# Patient Record
Sex: Female | Born: 1960 | Race: White | Hispanic: No | Marital: Single | Smoking: Never smoker
Health system: Southern US, Community
[De-identification: ages and names within clinical notes are randomized; demographics above are authoritative.]

## PROBLEM LIST (undated history)

## (undated) DIAGNOSIS — N200 Calculus of kidney: Secondary | ICD-10-CM

## (undated) DIAGNOSIS — K760 Fatty (change of) liver, not elsewhere classified: Secondary | ICD-10-CM

## (undated) DIAGNOSIS — K8020 Calculus of gallbladder without cholecystitis without obstruction: Secondary | ICD-10-CM

## (undated) DIAGNOSIS — C801 Malignant (primary) neoplasm, unspecified: Secondary | ICD-10-CM

## (undated) DIAGNOSIS — R319 Hematuria, unspecified: Secondary | ICD-10-CM

## (undated) HISTORY — PX: BREAST REDUCTION SURGERY: SHX8

## (undated) HISTORY — DX: Calculus of gallbladder without cholecystitis without obstruction: K80.20

## (undated) HISTORY — PX: TONSILLECTOMY: SUR1361

## (undated) HISTORY — PX: SOFT TISSUE TUMOR RESECTION: SHX1054

## (undated) HISTORY — DX: Calculus of kidney: N20.0

## (undated) HISTORY — PX: ABDOMINAL SURGERY: SHX537

## (undated) HISTORY — DX: Hematuria, unspecified: R31.9

## (undated) HISTORY — DX: Fatty (change of) liver, not elsewhere classified: K76.0

---

## 2011-07-23 ENCOUNTER — Encounter: Payer: Self-pay | Admitting: *Deleted

## 2011-07-23 ENCOUNTER — Emergency Department
Admission: EM | Admit: 2011-07-23 | Discharge: 2011-07-23 | Disposition: A | Discharge status: Home / Self Care | Payer: 59 | Source: Home / Self Care

## 2011-07-23 DIAGNOSIS — Z23 Encounter for immunization: Secondary | ICD-10-CM

## 2011-07-23 MED ORDER — INFLUENZA VAC TYP A&B SURF ANT IM INJ
0.5000 mL | INJECTION | Freq: Once | INTRAMUSCULAR | Status: AC
  Administered 2011-07-23: 0.5 mL via INTRAMUSCULAR

## 2011-07-23 NOTE — ED Notes (Signed)
The pt is here today for a flu vaccine.  

## 2014-11-17 ENCOUNTER — Emergency Department (INDEPENDENT_AMBULATORY_CARE_PROVIDER_SITE_OTHER)
Admission: EM | Admit: 2014-11-17 | Discharge: 2014-11-17 | Disposition: A | Discharge status: Home / Self Care | Payer: 59 | Source: Home / Self Care | Attending: Family Medicine | Admitting: Family Medicine

## 2014-11-17 ENCOUNTER — Encounter: Payer: Self-pay | Admitting: *Deleted

## 2014-11-17 DIAGNOSIS — J0120 Acute ethmoidal sinusitis, unspecified: Secondary | ICD-10-CM

## 2014-11-17 MED ORDER — FLUTICASONE PROPIONATE 50 MCG/ACT NA SUSP: 2.0000 | Freq: Every day | NASAL | Status: DC

## 2014-11-17 MED ORDER — SALINE SPRAY 0.65 % NA SOLN: 1.0000 | NASAL | Status: DC | PRN

## 2014-11-17 MED ORDER — AMOXICILLIN-POT CLAVULANATE 250-62.5 MG/5ML PO SUSR: 250.0000 mg | Freq: Three times a day (TID) | ORAL | Status: AC

## 2014-11-17 MED ORDER — AMOXICILLIN-POT CLAVULANATE 400-57 MG/5ML PO SUSR: 1000.0000 mg | Freq: Two times a day (BID) | ORAL | Status: AC

## 2014-11-17 NOTE — Progress Notes (Signed)
EDCM received phone call from [facility] regarding augmentin prescription strength 250mg  of which they do not carry.  [facility] requesting [facility] dosage strength.  EDCM spoke to [place] who changed prescription to Augmentin 400-57 per 5cc's take 12.5cc's (1000mg  total) twice daily for 10 days.  [facility] called [facility] to [facility] at [phone] and spoke to [facility].  No further EDCM needs at this time.

## 2014-11-17 NOTE — ED Notes (Signed)
Pt c/o productive cough and wheezing at night x 2 wks. She reports some blood tinged mucus this morning. Denies fever.

## 2014-11-17 NOTE — Discharge Instructions (Signed)
Be sure to rest and stay well hydrated. You may find that a humidifier or warm shower before bed with help you feel more comfortable. You may take acetaminophen and/or ibuprofen for fever and pain.  Today you were diagnosed with a sinus infection and prescribed an antibiotic. Be sure to complete the entire course even if you start to feel better. See below for further instructions.   Sinusitis Sinusitis is redness, soreness, and puffiness (inflammation) of the air pockets in the bones of your face (sinuses). The redness, soreness, and puffiness can cause air and mucus to get trapped in your sinuses. This can allow germs to grow and cause an infection.  HOME CARE   Drink enough fluids to keep your pee (urine) clear or pale yellow.  Use a humidifier in your home.  Run a hot shower to create steam in the bathroom. Sit in the bathroom with the door closed. Breathe in the steam 3-4 times a day.  Put a warm, moist washcloth on your face 3-4 times a day, or as told by your doctor.  Use salt water sprays (saline sprays) to wet the thick fluid in your nose. This can help the sinuses drain.  Only take medicine as told by your doctor. GET HELP RIGHT AWAY IF:   Your pain gets worse.  You have very bad headaches.  You are sick to your stomach (nauseous).  You throw up (vomit).  You are very sleepy (drowsy) all the time.  Your face is puffy (swollen).  Your vision changes.  You have a stiff neck.  You have trouble breathing. MAKE SURE YOU:   Understand these instructions.  Will watch your condition.  Will get help right away if you are not doing well or get worse. Document Released: 01/16/2008 Document Revised: 04/23/2012 Document Reviewed: 03/04/2012 [facility] Patient Information 2015 [place], [place]. This information is not intended to replace advice given to you by your health care provider. Make sure you discuss any questions you have with your health care provider.  Upper  Respiratory Infection, Adult An upper respiratory infection (URI) is also known as the common cold. It is often caused by a type of germ (virus). Colds are easily spread (contagious). You can pass it to others by kissing, coughing, sneezing, or drinking out of the same glass. Usually, you get better in 1 or 2 weeks.  HOME CARE   Only take medicine as told by your doctor.  Use a warm mist humidifier or breathe in steam from a hot shower.  Drink enough water and fluids to keep your pee (urine) clear or pale yellow.  Get plenty of rest.  Return to work when your temperature is back to normal or as told by your doctor. You may use a face mask and wash your hands to stop your cold from spreading. GET HELP RIGHT AWAY IF:   After the first few days, you feel you are getting worse.  You have questions about your medicine.  You have chills, shortness of breath, or brown or red spit (mucus).  You have yellow or brown snot (nasal discharge) or pain in the face, especially when you bend forward.  You have a fever, puffy (swollen) neck, pain when you swallow, or white spots in the back of your throat.  You have a bad headache, ear pain, sinus pain, or chest pain.  You have a high-pitched whistling sound when you breathe in and out (wheezing).  You have a lasting cough or cough up blood.  You have sore muscles or a stiff neck. MAKE SURE YOU:   Understand these instructions.  Will watch your condition.  Will get help right away if you are not doing well or get worse. Document Released: 01/16/2008 Document Revised: 10/22/2011 Document Reviewed: 11/04/2013 [facility] Patient Information 2015 [place], [place]. This information is not intended to replace advice given to you by your health care provider. Make sure you discuss any questions you have with your health care provider.

## 2014-11-17 NOTE — ED Provider Notes (Signed)
CSN: [id number]     Arrival date & time 11/17/14  1517 History   First MD Initiated Contact with Patient 11/17/14 1538     Chief Complaint  Patient presents with  . Cough   (Consider location/radiation/quality/duration/timing/severity/associated sxs/prior Treatment) HPI  Pt is a 54yo female presenting to urgent care with c/o cough with associated wheeze at night, facial pain with headaches and Left ear pain for the last 2 weeks.  Pt reports this morning she became concerned when she coughed up a small amount of blood tinged mucus one time.  States she has tried ibuprofen at home with minimal relief. No other medications tried at home. Denies fever, chills, n/v/d. States her son at home is also sick with similar symptoms. No recent travel. No hx of asthma or COPD. Pt is not a smoker.   History reviewed. No pertinent past medical history. Past Surgical History  Procedure Laterality Date  . Abdominal surgery      csection  . Tonsillectomy    . Cesarean section     Family History  Problem Relation Age of Onset  . Cancer Mother     lung  . Heart attack Father    History  Substance Use Topics  . Smoking status: Never Smoker   . Smokeless tobacco: Not on file  . Alcohol Use: No   OB History    No data available     Review of Systems  Constitutional: Positive for fatigue. Negative for fever, chills and appetite change.  HENT: Positive for congestion, rhinorrhea, sinus pressure and sore throat. Negative for ear pain, facial swelling and nosebleeds.   Respiratory: Positive for cough. Negative for shortness of breath.   Cardiovascular: Negative for chest pain and palpitations.  Gastrointestinal: Negative for nausea, vomiting, abdominal pain and diarrhea.  Musculoskeletal: Negative for myalgias, back pain, neck pain and neck stiffness.  All other systems reviewed and are negative.   Allergies  Review of patient's allergies indicates no known allergies.  Home Medications   Prior  to Admission medications   Medication Sig Start Date End Date Taking? Authorizing Provider  amoxicillin-clavulanate (AUGMENTIN) 250-62.5 MG/5ML suspension Take 5 mLs (250 mg total) by mouth 3 (three) times daily. For 10 days 11/17/14 11/24/14  Noland Fordyce, PA-C  fluticasone [facility]) 50 MCG/ACT nasal spray Place 2 sprays into both nostrils daily. 11/17/14   Noland Fordyce, PA-C  sodium chloride (OCEAN) 0.65 % SOLN nasal spray Place 1 spray into both nostrils as needed for congestion. 11/17/14   Noland Fordyce, PA-C   BP 148/85 mmHg  Pulse 96  Temp(Src) 98.1 F (36.7 C)  Resp 18  Ht 5\' 4"  (1.626 m)  Wt 247 lb (112.038 kg)  BMI 42.38 kg/m2  SpO2 96%  LMP 10/26/2014 Physical Exam  Constitutional: She appears well-developed and well-nourished. No distress.  HENT:  Head: Normocephalic and atraumatic.  Right Ear: Hearing, tympanic membrane and external ear normal. A foreign body ( eustachian tube (permanent) ) is present.  Left Ear: Hearing, tympanic membrane, external ear and ear canal normal.  Nose: Mucosal edema present. Right sinus exhibits frontal sinus tenderness. Right sinus exhibits no maxillary sinus tenderness. Left sinus exhibits frontal sinus tenderness. Left sinus exhibits no maxillary sinus tenderness.  Mouth/Throat: Uvula is midline, oropharynx is clear and moist and mucous membranes are normal.  Eyes: Conjunctivae are normal. No scleral icterus.  Neck: Normal range of motion.  Cardiovascular: Normal rate, regular rhythm and normal heart sounds.   Pulmonary/Chest: Effort normal and breath sounds  normal. No respiratory distress. She has no wheezes. She has no rales. She exhibits no tenderness.  Abdominal: Soft. She exhibits no distension. There is no tenderness.  Musculoskeletal: Normal range of motion.  Neurological: She is alert.  Skin: Skin is warm and dry. She is not diaphoretic.  Nursing note and vitals reviewed.   ED Course  Procedures (including critical care time) Labs  Review Labs Reviewed - No data to display  Imaging Review No results found.   MDM   1. Acute ethmoidal sinusitis, recurrence not specified      Pt presenting to urgent care with c/o URI symptoms for 2 weeks.  Symptoms most concerning for pt are head pain and 1 episode of blood tinged sputum this morning.  Pt reports wheeze at night but denies chest pain or SOB. On exam, pt is afebrile, appears well, non-toxic.  Pt does have tenderness to frontal sinuses on exam with nasal mucosal edema and erythema. Lungs: CTAB. Symptoms more c/w sinusitis. Doubt pneumonia at this time. Doubt TB. Blood most likely due to irritation in pt's sinuses. Rx: augmentin, flonase and ocean nasal spray.  Home care instructions provided. Advised to f/u with PCP or return to UC as needed if symptoms not improving in 1 week. Pt verbalized understanding and agreement with tx plan.    Noland Fordyce, PA-C 11/17/14 1623

## 2015-07-18 ENCOUNTER — Emergency Department (INDEPENDENT_AMBULATORY_CARE_PROVIDER_SITE_OTHER): Payer: 59

## 2015-07-18 ENCOUNTER — Emergency Department (INDEPENDENT_AMBULATORY_CARE_PROVIDER_SITE_OTHER)
Admission: EM | Admit: 2015-07-18 | Discharge: 2015-07-18 | Disposition: A | Discharge status: Home / Self Care | Payer: 59 | Source: Home / Self Care | Attending: Family Medicine | Admitting: Family Medicine

## 2015-07-18 ENCOUNTER — Encounter: Payer: Self-pay | Admitting: *Deleted

## 2015-07-18 DIAGNOSIS — S8012XA Contusion of left lower leg, initial encounter: Secondary | ICD-10-CM

## 2015-07-18 DIAGNOSIS — M79662 Pain in left lower leg: Secondary | ICD-10-CM | POA: Diagnosis not present

## 2015-07-18 DIAGNOSIS — M25562 Pain in left knee: Secondary | ICD-10-CM | POA: Diagnosis not present

## 2015-07-18 DIAGNOSIS — W010XXA Fall on same level from slipping, tripping and stumbling without subsequent striking against object, initial encounter: Secondary | ICD-10-CM

## 2015-07-18 DIAGNOSIS — W1839XA Other fall on same level, initial encounter: Secondary | ICD-10-CM

## 2015-07-18 MED ORDER — TRAMADOL HCL 50 MG PO TABS: 50.0000 mg | ORAL_TABLET | Freq: Four times a day (QID) | ORAL | Status: DC | PRN

## 2015-07-18 MED ORDER — IBUPROFEN 600 MG PO TABS: 600.0000 mg | ORAL_TABLET | Freq: Four times a day (QID) | ORAL | Status: DC | PRN

## 2015-07-18 NOTE — ED Notes (Signed)
Patients reports stepping out of a booth at [employer] [employer], missing the step next to the booth and falling onto her left knee. Also c/o left hand pain and aching all over.

## 2015-07-18 NOTE — Discharge Instructions (Signed)
Tramadol is strong pain medication. While taking, do not drink alcohol, drive, or perform any other activities that requires focus while taking this medication. You may take ibuprofen at the same time as this medication.   Ibuprofen should not make you tired so you should be able to take every 6-8 hours for pain and swelling.    Contusion A contusion is a deep bruise. Contusions are the result of a blunt injury to tissues and muscle fibers under the skin. The injury causes bleeding under the skin. The skin overlying the contusion may turn blue, purple, or yellow. Minor injuries will give you a painless contusion, but more severe contusions may stay painful and swollen for a few weeks.  CAUSES  This condition is usually caused by a blow, trauma, or direct force to an area of the body. SYMPTOMS  Symptoms of this condition include:  Swelling of the injured area.  Pain and tenderness in the injured area.  Discoloration. The area may have redness and then turn blue, purple, or yellow. DIAGNOSIS  This condition is diagnosed based on a physical exam and medical history. An X-ray, CT scan, or MRI may be needed to determine if there are any associated injuries, such as broken bones (fractures). TREATMENT  Specific treatment for this condition depends on what area of the body was injured. In general, the best treatment for a contusion is resting, icing, applying pressure to (compression), and elevating the injured area. This is often called the RICE strategy. Over-the-counter anti-inflammatory medicines may also be recommended for pain control.  HOME CARE INSTRUCTIONS   Rest the injured area.  If directed, apply ice to the injured area:  Put ice in a plastic bag.  Place a towel between your skin and the bag.  Leave the ice on for 20 minutes, 2-3 times per day.  If directed, apply light compression to the injured area using an elastic bandage. Make sure the bandage is not wrapped too tightly.  Remove and reapply the bandage as directed by your health care provider.  If possible, raise (elevate) the injured area above the level of your heart while you are sitting or lying down.  Take over-the-counter and prescription medicines only as told by your health care provider. SEEK MEDICAL CARE IF:  Your symptoms do not improve after several days of treatment.  Your symptoms get worse.  You have difficulty moving the injured area. SEEK IMMEDIATE MEDICAL CARE IF:   You have severe pain.  You have numbness in a hand or foot.  Your hand or foot turns pale or cold.   This information is not intended to replace advice given to you by your health care provider. Make sure you discuss any questions you have with your health care provider.   Document Released: 05/09/2005 Document Revised: 04/20/2015 Document Reviewed: 12/15/2014 Elsevier Interactive Patient Education [employer].

## 2015-07-18 NOTE — ED Provider Notes (Signed)
CSN: [id number]     Arrival date & time 07/18/15  1637 History   First MD Initiated Contact with Patient 07/18/15 1652     Chief Complaint  Patient presents with  . Knee Pain   (Consider location/radiation/quality/duration/timing/severity/associated sxs/prior Treatment) HPI Pt is a 54yo female presenting to [facility] with c/o Left knee pain after falling last night at [facility].  Pt states she was in a booth that had a step down, she missed the step and fell directly onto Left knee.  Pt also c/o Left little finger pain but states "I think it's just jammed, it will be okay."  Pt states she was not going to come but the manager at the restaurant insisted she come be evaluated.  Pain in knee is aching and throbbing, 5/10, improved moderately with Advil last night but woke her around 3AM throbbing.  Pt also reports mild generalized headache but does not recall hitting her head or LOC. Denies nausea or vomiting. Denies neck or back pain.   History reviewed. No pertinent past medical history. Past Surgical History  Procedure Laterality Date  . Abdominal surgery      csection  . Tonsillectomy    . Cesarean section     Family History  Problem Relation Age of Onset  . Cancer Mother     lung  . Heart attack Father    Social History  Substance Use Topics  . Smoking status: Never Smoker   . Smokeless tobacco: None  . Alcohol Use: No   OB History    No data available     Review of Systems  Musculoskeletal: Positive for myalgias, joint swelling, arthralgias and gait problem. Negative for back pain.       Left knee  Skin: Positive for color change. Negative for wound.  Neurological: Negative for weakness and numbness.    Allergies  Review of patient's allergies indicates no known allergies.  Home Medications   Prior to Admission medications   Medication Sig Start Date End Date Taking? Authorizing Provider  fluticasone (FLONASE) 50 MCG/ACT nasal spray Place 2 sprays into both nostrils  daily. 11/17/14   Noland Fordyce, PA-C  ibuprofen (ADVIL,MOTRIN) 600 MG tablet Take 1 tablet (600 mg total) by mouth every 6 (six) hours as needed. 07/18/15   Noland Fordyce, PA-C  traMADol (ULTRAM) 50 MG tablet Take 1 tablet (50 mg total) by mouth every 6 (six) hours as needed. 07/18/15   Noland Fordyce, PA-C   Meds Ordered and Administered this Visit  Medications - No data to display  BP 135/83 mmHg  Pulse 91  Resp 16  Wt 230 lb (104.327 kg)  SpO2 94%  LMP 10/26/2014 No data found.   Physical Exam  Constitutional: She is oriented to person, place, and time. She appears well-developed and well-nourished.  HENT:  Head: Normocephalic and atraumatic.  Eyes: EOM are normal.  Neck: Normal range of motion. Neck supple.  No midline bone tenderness, no crepitus or step-offs.    Cardiovascular: Normal rate.   Pulmonary/Chest: Effort normal.  Musculoskeletal: Normal range of motion. She exhibits edema and tenderness.       Left knee: Tenderness found.  Left knee: full ROM, tenderness and mild edema over tibial tuberosity.  Mild tenderness to posterior knee. No calf tenderness.    Left little finger: mild tenderness at base of finger and 5th metacarpal. No edema. Full ROM. Full ROM hand and wrist.   Neurological: She is alert and oriented to person, place, and time.  Left leg: normal sensation.  Left hand: normal sensation   Skin: Skin is warm and dry.  Left knee: mild ecchymosis. Skin in tact  Psychiatric: She has a normal mood and affect. Her behavior is normal.  Nursing note and vitals reviewed.   ED Course  Procedures (including critical care time)  Labs Review Labs Reviewed - No data to display  Imaging Review Dg Tibia/fibula Left  07/18/2015  CLINICAL DATA:  Left lower extremity pain status post fall. EXAM: LEFT TIBIA AND FIBULA - 2 VIEW COMPARISON:  None. FINDINGS: There is no evidence of fracture or other focal bone lesions. Soft tissues are unremarkable. IMPRESSION: Negative.  Electronically Signed   By: Fidela Salisbury M.D.   On: 07/18/2015 17:28   Dg Knee Complete 4 Views Left  07/18/2015  CLINICAL DATA:  Recent fall with left knee pain, initial encounter EXAM: LEFT KNEE - COMPLETE 4+ VIEW COMPARISON:  None. FINDINGS: There is no evidence of fracture, dislocation, or joint effusion. There is no evidence of arthropathy or other focal bone abnormality. Soft tissues are unremarkable. IMPRESSION: No acute abnormality noted. Electronically Signed   By: Inez Catalina M.D.   On: 07/18/2015 17:26    MDM   1. Contusion of left leg, initial encounter   2. Left knee pain   3. Fall from slip, trip, or stumble, initial encounter    Pt c/o Left knee pain after a trip and fall last night. Denies hitting her head or LOC.   Pt does c/o Left little finger pain but declined imaging of Left hand stating she thinks she just jammed it. Left knee: no acute fracture or dislocation. Reassured pt. Offered crutches and ace wrap, pt declined.   Discussed RICE home treatment.   Rx: Tramadol and ibuprofen F/u with Dr. Georgina Snell, Sports Medicine, in 1-2 weeks if not improving. Patient verbalized understanding and agreement with treatment plan.           Noland Fordyce, PA-C 07/18/15 [phone]

## 2015-10-14 ENCOUNTER — Emergency Department (INDEPENDENT_AMBULATORY_CARE_PROVIDER_SITE_OTHER)
Admission: EM | Admit: 2015-10-14 | Discharge: 2015-10-14 | Disposition: A | Discharge status: Home / Self Care | Payer: 59 | Source: Home / Self Care | Attending: Family Medicine | Admitting: Family Medicine

## 2015-10-14 ENCOUNTER — Encounter: Payer: Self-pay | Admitting: Emergency Medicine

## 2015-10-14 DIAGNOSIS — J9801 Acute bronchospasm: Secondary | ICD-10-CM

## 2015-10-14 DIAGNOSIS — B9789 Other viral agents as the cause of diseases classified elsewhere: Principal | ICD-10-CM

## 2015-10-14 DIAGNOSIS — B001 Herpesviral vesicular dermatitis: Secondary | ICD-10-CM

## 2015-10-14 DIAGNOSIS — J069 Acute upper respiratory infection, unspecified: Secondary | ICD-10-CM

## 2015-10-14 MED ORDER — PREDNISONE 20 MG PO TABS: 20.0000 mg | ORAL_TABLET | Freq: Two times a day (BID) | ORAL | Status: DC

## 2015-10-14 MED ORDER — AZITHROMYCIN 250 MG PO TABS: ORAL_TABLET | ORAL | Status: DC

## 2015-10-14 MED ORDER — BENZONATATE 200 MG PO CAPS: 200.0000 mg | ORAL_CAPSULE | Freq: Every day | ORAL | Status: DC

## 2015-10-14 MED ORDER — ACYCLOVIR 400 MG PO TABS: ORAL_TABLET | ORAL | Status: DC

## 2015-10-14 NOTE — ED Provider Notes (Signed)
CSN: [id number]     Arrival date & time 10/14/15  0841 History   First MD Initiated Contact with Patient 10/14/15 1012     Chief Complaint  Patient presents with  . Nasal Congestion  . Generalized Body Aches  . Cough      HPI Comments: Patient complains of four day history of typical cold-like symptoms developing over several days,  including mild sore throat, sinus congestion, headache, fatigue, and cough.  She has also developed a cold sore, and requests medication for this.   She has a history of recurrent otitis media, and family history of asthma (father)  The history is provided by the patient.    History reviewed. No pertinent past medical history. Past Surgical History  Procedure Laterality Date  . Abdominal surgery      csection  . Tonsillectomy    . Cesarean section     Family History  Problem Relation Age of Onset  . Cancer Mother     lung  . Heart attack Father   . Hypertension Father    Social History  Substance Use Topics  . Smoking status: Never Smoker   . Smokeless tobacco: None  . Alcohol Use: No   OB History    No data available     Review of Systems + sore throat + hoarse + cough No pleuritic pain + wheezing + nasal congestion + post-nasal drainage No sinus pain/pressure No itchy/red eyes No earache No hemoptysis No SOB + fever, + chills No nausea No vomiting No abdominal pain No diarrhea No urinary symptoms No skin rash + fatigue No myalgias + headache Used OTC meds without relief  Allergies  Review of patient's allergies indicates no known allergies.  Home Medications   Prior to Admission medications   Medication Sig Start Date End Date Taking? Authorizing Provider  acyclovir (ZOVIRAX) 400 MG tablet Take one tab PO TID for 5 days for recurrent cold sores 10/14/15   Kandra Nicolas, MD  azithromycin (ZITHROMAX Z-PAK) 250 MG tablet Take 2 tabs today; then begin one tab once daily for 4 more days. (Rx void after 10/22/15) 10/14/15    Kandra Nicolas, MD  benzonatate (TESSALON) 200 MG capsule Take 1 capsule (200 mg total) by mouth at bedtime. Take as needed for cough 10/14/15   Kandra Nicolas, MD  fluticasone [facility]) 50 MCG/ACT nasal spray Place 2 sprays into both nostrils daily. 11/17/14   Noland Fordyce, PA-C  ibuprofen (ADVIL,MOTRIN) 600 MG tablet Take 1 tablet (600 mg total) by mouth every 6 (six) hours as needed. 07/18/15   Noland Fordyce, PA-C  predniSONE (DELTASONE) 20 MG tablet Take 1 tablet (20 mg total) by mouth 2 (two) times daily. Take with food. 10/14/15   Kandra Nicolas, MD  traMADol (ULTRAM) 50 MG tablet Take 1 tablet (50 mg total) by mouth every 6 (six) hours as needed. 07/18/15   Noland Fordyce, PA-C   Meds Ordered and Administered this Visit  Medications - No data to display  BP 139/85 mmHg  Pulse 95  Temp(Src) 98.4 F (36.9 C) (Oral)  Resp 18  Ht 5\' 4"  (1.626 m)  Wt 234 lb (106.142 kg)  BMI 40.15 kg/m2  SpO2 95%  LMP 10/26/2014 No data found.   Physical Exam Nursing notes and Vital Signs reviewed. Appearance:  Patient appears stated age, and in no acute distress.  Patient is obese (BMI 40.2) Eyes:  Pupils are equal, round, and reactive to light and accomodation.  Extraocular  movement is intact.  Conjunctivae are not inflamed  Ears:  Canals normal;   Ventilation tube in right tympanic membrane.  Left tympanic membrane normal  Nose:  Mildly congested turbinates.  No sinus tenderness. Mouth:  Small lip excoriation present   Pharynx:  Erythema uvula, otherwise normal. Neck:  Supple.  Tender enlarged posterior nodes are palpated bilaterally  Lungs:   Posterior expiratory wheezes.  Breath sounds are equal.  Moving air well. Heart:  Regular rate and rhythm without murmurs, rubs, or gallops.  Abdomen:  Nontender without masses or hepatosplenomegaly.  Bowel sounds are present.  No CVA or flank tenderness.  Extremities:  No edema.  Skin:  No rash present.   ED Course  Procedures  None    MDM   1.  Viral URI with cough   2. Bronchospasm, acute   3. Recurrent cold sores      Begin prednisone burst.  Prescription written for Benzonatate (Tessalon) to take at bedtime for night-time cough.  Rx for Acyclovir. Take plain guaifenesin (1200mg  extended release tabs such as Mucinex) twice daily, with plenty of water, for cough and congestion.  May add Pseudoephedrine (30mg , one or two every 4 to 6 hours) for sinus congestion.  Get adequate rest.   May use Afrin nasal spray (or generic oxymetazoline) twice daily for about 5 days and then discontinue.  Also recommend using saline nasal spray several times daily and saline nasal irrigation (AYR is a common brand).    Try warm salt water gargles for sore throat.  Stop all antihistamines for now, and other non-prescription cough/cold preparations.. Begin Azithromycin if not improving about one week or if persistent fever develops (Given a prescription to hold, with an expiration date)  Follow-up with family doctor if not improving about10 days.     Kandra Nicolas, MD 10/17/15 2142

## 2015-10-14 NOTE — Discharge Instructions (Signed)
Take plain guaifenesin (1200mg  extended release tabs such as Mucinex) twice daily, with plenty of water, for cough and congestion.  May add Pseudoephedrine (30mg , one or two every 4 to 6 hours) for sinus congestion.  Get adequate rest.   May use Afrin nasal spray (or generic oxymetazoline) twice daily for about 5 days and then discontinue.  Also recommend using saline nasal spray several times daily and saline nasal irrigation (AYR is a common brand).    Try warm salt water gargles for sore throat.  Stop all antihistamines for now, and other non-prescription cough/cold preparations.. Begin Azithromycin if not improving about one week or if persistent fever develops  Follow-up with family doctor if not improving about10 days.

## 2015-10-14 NOTE — ED Notes (Signed)
Reports 3 day history of congestion, cough, aches and sense of fever. Took Mucinex and Ibuprofen around 5 am.

## 2016-10-10 ENCOUNTER — Emergency Department (INDEPENDENT_AMBULATORY_CARE_PROVIDER_SITE_OTHER)
Admission: EM | Admit: 2016-10-10 | Discharge: 2016-10-10 | Disposition: A | Discharge status: Home / Self Care | Payer: BLUE CROSS/BLUE SHIELD | Source: Home / Self Care | Attending: Family Medicine | Admitting: Family Medicine

## 2016-10-10 DIAGNOSIS — R300 Dysuria: Secondary | ICD-10-CM

## 2016-10-10 DIAGNOSIS — N3001 Acute cystitis with hematuria: Secondary | ICD-10-CM

## 2016-10-10 LAB — POCT URINALYSIS DIP (MANUAL ENTRY)
Glucose, UA: 100 — AB
Nitrite, UA: POSITIVE — AB
Protein Ur, POC: >=300 — AB
Spec Grav, UA: 1.02 (ref 1.005–1.03)
Urobilinogen, UA: 4 (ref 0–1)
pH, UA: 8.5 (ref 5–8)

## 2016-10-10 MED ORDER — CEPHALEXIN 500 MG PO CAPS: 500.0000 mg | ORAL_CAPSULE | Freq: Two times a day (BID) | ORAL | 0 refills | Status: DC

## 2016-10-10 NOTE — ED Triage Notes (Signed)
Pt stated that she had bloody urine starting yesterday morning.  Today she has been passing clots.

## 2016-10-10 NOTE — ED Provider Notes (Signed)
CSN: [id number]     Arrival date & time 10/10/16  1434 History   First MD Initiated Contact with Patient 10/10/16 1451     Chief Complaint  Patient presents with  . Hematuria   (Consider location/radiation/quality/duration/timing/severity/associated sxs/prior Treatment) HPI  Courtney Carr is a 56 y.o. female presenting to UC with c/o hematuria and mild dysuria with some passing of clots started yesterday.  Hx of similar symptoms about 5-6 months ago.  She has not tried OTC Azo but notes she does drink and eat a lot of lemons without much water.  She wonders if that could have caused current infection. Denies fever, chills, n/v/d. Mild intermittent lower back pain. No vaginal symptoms or abdominal pain.    History reviewed. No pertinent past medical history. Past Surgical History:  Procedure Laterality Date  . ABDOMINAL SURGERY     csection  . CESAREAN SECTION    . TONSILLECTOMY     Family History  Problem Relation Age of Onset  . Cancer Mother     lung  . Heart attack Father   . Hypertension Father    Social History  Substance Use Topics  . Smoking status: Never Smoker  . Smokeless tobacco: Not on file  . Alcohol use No   OB History    No data available     Review of Systems  Constitutional: Negative for chills and fever.  Gastrointestinal: Negative for abdominal pain, diarrhea, nausea and vomiting.  Genitourinary: Positive for dysuria and hematuria. Negative for frequency, pelvic pain, urgency, vaginal bleeding, vaginal discharge and vaginal pain.  Musculoskeletal: Positive for back pain. Negative for myalgias.    Allergies  Patient has no known allergies.  Home Medications   Prior to Admission medications   Medication Sig Start Date End Date Taking? 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 (Rx void after 10/22/15) 10/14/15   Kandra Nicolas, MD  benzonatate (TESSALON) 200 MG capsule Take 1 capsule (200 mg total) by mouth at bedtime. Take as needed for cough 10/14/15   Kandra Nicolas, MD  cephALEXin (KEFLEX) 500 MG capsule Take 1 capsule (500 mg total) by mouth 2 (two) times daily. 10/10/16   Noland Fordyce, PA-C  fluticasone (FLONASE) 50 MCG/ACT nasal spray Place 2 sprays into both nostrils daily. 11/17/14   Noland Fordyce, PA-C  ibuprofen (ADVIL,MOTRIN) 600 MG tablet Take 1 tablet (600 mg total) by mouth every 6 (six) hours as needed. 07/18/15   Noland Fordyce, PA-C  predniSONE (DELTASONE) 20 MG tablet Take 1 tablet (20 mg total) by mouth 2 (two) times daily. Take with food. 10/14/15   Kandra Nicolas, MD  traMADol (ULTRAM) 50 MG tablet Take 1 tablet (50 mg total) by mouth every 6 (six) hours as needed. 07/18/15   Noland Fordyce, PA-C   Meds Ordered and Administered this Visit  Medications - No data to display  BP 115/84 (BP Location: Left Arm)   Pulse 104   Temp 98.2 F (36.8 C) (Oral)   Ht 5\' 4"  (1.626 m)   Wt 246 lb (111.6 kg)   LMP 10/26/2014   SpO2 95%   BMI 42.23 kg/m  No data found.   Physical Exam  Constitutional: She is oriented to person, place, and time. She appears  well-developed and well-nourished. No distress.  HENT:  Head: Normocephalic and atraumatic.  Mouth/Throat: Oropharynx is clear and moist.  Eyes: EOM are normal.  Neck: Normal range of motion.  Cardiovascular: Normal rate and regular rhythm.   Pulmonary/Chest: Effort normal and breath sounds normal. No respiratory distress. She has no wheezes. She has no rales.  Abdominal: Soft. She exhibits no distension and no mass. There is no tenderness. There is no rebound, no guarding and no CVA tenderness.  Musculoskeletal: Normal range of motion.  Neurological: She is alert and oriented to person, place, and time.  Skin: Skin is warm and dry. She is not diaphoretic.  Psychiatric:  She has a normal mood and affect. Her behavior is normal.  Nursing note and vitals reviewed.   Urgent Care Course     Procedures (including critical care time)  Labs Review Labs Reviewed  POCT URINALYSIS DIP (MANUAL ENTRY) - Abnormal; Notable for the following:       Result Value   Color, UA red (*)    Glucose, UA =100 (*)    Bilirubin, UA large (*)    Ketones, POC UA moderate (40) (*)    Blood, UA large (*)    Protein Ur, POC >=300 (*)    Nitrite, UA Positive (*)    Leukocytes, UA large (3+) (*)    All other components within normal limits  URINE CULTURE    Imaging Review No results found.   MDM   1. Dysuria   2. Acute cystitis with hematuria    UA c/w UTI, will send culture.  Rx: Keflex  Encouraged pt to drink more water F/u with PCP or urologist if hematuria continues.     Noland Fordyce, PA-C 10/10/16 1521

## 2016-10-12 ENCOUNTER — Telehealth: Payer: Self-pay | Admitting: Emergency Medicine

## 2016-10-12 LAB — URINE CULTURE

## 2016-10-13 NOTE — Telephone Encounter (Signed)
Patient states she is doing much better; gave her negative urine culture results.

## 2016-12-08 ENCOUNTER — Encounter: Payer: Self-pay | Admitting: Emergency Medicine

## 2016-12-08 ENCOUNTER — Emergency Department (INDEPENDENT_AMBULATORY_CARE_PROVIDER_SITE_OTHER)
Admission: EM | Admit: 2016-12-08 | Discharge: 2016-12-08 | Disposition: A | Discharge status: Home / Self Care | Payer: BLUE CROSS/BLUE SHIELD | Source: Home / Self Care | Attending: Family Medicine | Admitting: Family Medicine

## 2016-12-08 DIAGNOSIS — S29012A Strain of muscle and tendon of back wall of thorax, initial encounter: Secondary | ICD-10-CM | POA: Diagnosis not present

## 2016-12-08 MED ORDER — METHOCARBAMOL 500 MG PO TABS: 500.0000 mg | ORAL_TABLET | Freq: Two times a day (BID) | ORAL | 0 refills | Status: DC

## 2016-12-08 NOTE — ED Triage Notes (Signed)
Pt c/o possible pulled muscle in her back between her shoulder blades x 1 week.  Pt is caring for her elderly neighbor who almost fell and she caught him a couple of days ago.

## 2016-12-08 NOTE — Discharge Instructions (Signed)
°  Robaxin (methocarbamol) is a muscle relaxer and may cause drowsiness. Do not drink alcohol, drive, or operate heavy machinery while taking.  You may also take 500mg  acetaminophen every 4-6 hours or in combination with ibuprofen 400-600mg  every 6-8 hours.

## 2016-12-08 NOTE — ED Provider Notes (Signed)
CSN: [id number]     Arrival date & time 12/08/16  1453 History   None    Chief Complaint  Patient presents with  . Back Pain   (Consider location/radiation/quality/duration/timing/severity/associated sxs/prior Treatment) HPI  Courtney Carr is a 56 y.o. female presenting to UC with c/o a possible pulled muscle in her mid to upper back around and below Left shoulder blade that started about 1 week ago.  She notes symptoms started after catching an elderly neighbor who stumbled.  Pain is sharp and spasm-like at times especially with certain movements. Pt notes her neighbor gave her a 5mg  tab of valium last night, which helped with the pain and helped her sleep.  She has also taken ibuprofen intermittently. Denies chest pain or SOB. Denies radiation of pain or numbness into arms or legs. No hx of back surgeries.    History reviewed. No pertinent past medical history. Past Surgical History:  Procedure Laterality Date  . ABDOMINAL SURGERY     csection  . CESAREAN SECTION    . TONSILLECTOMY     Family History  Problem Relation Age of Onset  . Cancer Mother     lung  . Heart attack Father   . Hypertension Father    Social History  Substance Use Topics  . Smoking status: Never Smoker  . Smokeless tobacco: Never Used  . Alcohol use No   OB History    No data available     Review of Systems  Constitutional: Negative for chills and fever.  Respiratory: Negative for cough, chest tightness and shortness of breath.   Gastrointestinal: Negative for abdominal pain, diarrhea, nausea and vomiting.  Genitourinary: Negative for dysuria, flank pain and frequency.  Musculoskeletal: Positive for back pain and myalgias. Negative for arthralgias, gait problem, joint swelling, neck pain and neck stiffness.  Skin: Negative for color change and rash.  Neurological: Negative for weakness and numbness.    Allergies  Patient has no known allergies.  Home Medications   Prior to Admission  medications   Medication Sig Start Date End Date Taking? Authorizing Provider  methocarbamol (ROBAXIN) 500 MG tablet Take 1 tablet (500 mg total) by mouth 2 (two) times daily. 12/08/16   Noland Fordyce, PA-C   Meds Ordered and Administered this Visit  Medications - No data to display  Pulse 95   Temp 98.3 F (36.8 C) (Oral)   Ht 5\' 4"  (1.626 m)   Wt 249 lb 8 oz (113.2 kg)   LMP 10/26/2014   SpO2 96%   BMI 42.83 kg/m  No data found.   Physical Exam  Constitutional: She is oriented to person, place, and time. She appears well-developed and well-nourished. No distress.  HENT:  Head: Normocephalic and atraumatic.  Eyes: EOM are normal.  Neck: Normal range of motion.  Cardiovascular: Normal rate.   Pulmonary/Chest: Effort normal. No respiratory distress.  Musculoskeletal: Normal range of motion. She exhibits tenderness. She exhibits no edema or deformity.       Back:  No midline spinal tenderness. Tenderness to Left upper trapezius and Left rhomboid muscles. No bony tenderness. Full ROM upper and lower extremities with 5/5 strength. Increased pain with Left arm adducted across chest.   Neurological: She is alert and oriented to person, place, and time.  Skin: Skin is warm and dry. No rash noted. She is not diaphoretic. No erythema.  Psychiatric: She has a normal mood and affect. Her behavior is normal.  Nursing note and vitals reviewed.   Urgent Care  Course     Procedures (including critical care time)  Labs Review Labs Reviewed - No data to display  Imaging Review No results found.    MDM   1. Muscle strain of left upper back, initial encounter    Hx and exam c/w Left upper back strain and intermittent muscle spasms.  Rx: Robaxin May continue ibuprofen and may had acetaminophen f/u with PCP in 1 week if not improving.     Noland Fordyce, PA-C 12/08/16 1600

## 2016-12-10 ENCOUNTER — Telehealth: Payer: Self-pay | Admitting: Emergency Medicine

## 2016-12-10 MED ORDER — DIAZEPAM 5 MG PO TABS: 5.0000 mg | ORAL_TABLET | Freq: Two times a day (BID) | ORAL | 0 refills | Status: DC

## 2016-12-10 NOTE — Telephone Encounter (Signed)
Pt requesting to be switched from Robaxin to Valium for back pain. She notes the Robaxin pills are too large and get stuck in her throat. She has had 5mg  valium in the past and did well.  Advised pt not to take both at same time. Use caution while taking as valium can cause drowsiness. Do not drive or drink alcohol while taking.

## 2017-07-16 ENCOUNTER — Other Ambulatory Visit: Payer: Self-pay

## 2017-07-16 ENCOUNTER — Emergency Department (INDEPENDENT_AMBULATORY_CARE_PROVIDER_SITE_OTHER)
Admission: EM | Admit: 2017-07-16 | Discharge: 2017-07-16 | Disposition: A | Discharge status: Home / Self Care | Payer: BLUE CROSS/BLUE SHIELD | Source: Home / Self Care | Attending: Family Medicine | Admitting: Family Medicine

## 2017-07-16 DIAGNOSIS — S61211A Laceration without foreign body of left index finger without damage to nail, initial encounter: Secondary | ICD-10-CM | POA: Diagnosis not present

## 2017-07-16 DIAGNOSIS — Z23 Encounter for immunization: Secondary | ICD-10-CM

## 2017-07-16 MED ORDER — TETANUS-DIPHTH-ACELL PERTUSSIS 5-2.5-18.5 LF-MCG/0.5 IM SUSP
0.5000 mL | Freq: Once | INTRAMUSCULAR | Status: AC
  Administered 2017-07-16: 0.5 mL via INTRAMUSCULAR

## 2017-07-16 MED ORDER — HYDROCODONE-ACETAMINOPHEN 5-325 MG PO TABS: 1.0000 | ORAL_TABLET | Freq: Four times a day (QID) | ORAL | 0 refills | Status: DC | PRN

## 2017-07-16 NOTE — ED Triage Notes (Signed)
Pt was cutting sausage with a pair of scissors, and cut the left pointer finger knuckle.

## 2017-07-16 NOTE — ED Provider Notes (Signed)
[name]    CSN: [id number] Arrival date & time: 07/16/17  1708     History   Chief Complaint Chief Complaint  Patient presents with  . Laceration    HPI Courtney Carr is a 56 y.o. female.   Patient lacerated her left second finger with a pair of scissors while cutting sausage about 30 minutes ago.  She does not remember her last Tdap. Patient also reports recurring back pain during the past month and hematuria.  CT (stone protocol) last year by [facility] showed multiple punctate stones in both kidneys.  She has not yet been evaluated by a urologist.   The history is provided by the patient.  Laceration  Location:  Finger Finger laceration location:  L index finger Length:  1cm Depth:  Through dermis Quality comment:  Flap laceration Bleeding: controlled   Injury mechanism: scissors. Pain details:    Quality:  Aching   Severity:  Mild   Timing:  Constant   Progression:  Unchanged Foreign body present:  No foreign bodies Relieved by:  None tried Worsened by:  Movement Ineffective treatments:  None tried Tetanus status:  Out of date Associated symptoms: no numbness and no swelling     History reviewed. No pertinent past medical history.  There are no active problems to display for this patient.   Past Surgical History:  Procedure Laterality Date  . ABDOMINAL SURGERY     csection  . CESAREAN SECTION    . TONSILLECTOMY      OB History    No data available       Home Medications    Prior to Admission medications   Medication Sig Start Date End Date Taking? Authorizing Provider  diazepam (VALIUM) 5 MG tablet Take 1 tablet (5 mg total) by mouth 2 (two) times daily. 12/10/16   Noe Gens, PA-C  HYDROcodone-acetaminophen (NORCO/VICODIN) 5-325 MG tablet Take 1 tablet by mouth every 6 (six) hours as needed for moderate pain. 07/16/17   Kandra Nicolas, MD    Family History Family History  Problem Relation Age of Onset  . Cancer Mother       lung  . Heart attack Father   . Hypertension Father     Social History Social History   Tobacco Use  . Smoking status: Never Smoker  . Smokeless tobacco: Never Used  Substance Use Topics  . Alcohol use: No  . Drug use: No     Allergies   Patient has no known allergies.   Review of Systems Review of Systems  All other systems reviewed and are negative.    Physical Exam Triage Vital Signs ED Triage Vitals  Enc Vitals Group     BP 07/16/17 1726 (!) 156/118     Pulse Rate 07/16/17 1726 96     Resp --      Temp 07/16/17 1726 97.6 F (36.4 C)     Temp Source 07/16/17 1726 Oral     SpO2 07/16/17 1726 96 %     Weight --      Height --      Head Circumference --      Peak Flow --      Pain Score 07/16/17 1727 0     Pain Loc --      Pain Edu? --      Excl. in [place]? --    No data found.  Updated Vital Signs BP (!) 156/118 (BP Location: Right Arm)   Pulse 96  Temp 97.6 F (36.4 C) (Oral)   LMP 10/26/2014   SpO2 96%   Visual Acuity Right Eye Distance:   Left Eye Distance:   Bilateral Distance:    Right Eye Near:   Left Eye Near:    Bilateral Near:     Physical Exam  Constitutional: She appears well-developed and well-nourished. No distress.  HENT:  Head: Atraumatic.  Eyes: Pupils are equal, round, and reactive to light.  Cardiovascular: Normal rate.  Pulmonary/Chest: Effort normal.  Musculoskeletal:       Left hand: She exhibits tenderness and laceration. She exhibits normal range of motion, no bony tenderness, normal two-point discrimination, normal capillary refill, no deformity and no swelling. Normal sensation noted.       Hands: Left second finger has a simple superficial 1cm flap laceration over the dorsal PIP joint.  Finger has full range of motion all joints.  Flexion/extension is intact.  Distal neurovascular function is intact.   Neurological: She is alert.  Skin: Skin is warm and dry.  Nursing note and vitals reviewed.    UC  Treatments / Results  Labs (all labs ordered are listed, but only abnormal results are displayed) Labs Reviewed - No data to display  EKG  EKG Interpretation None       Radiology No results found.  Procedures Procedures  Laceration Repair Discussed benefits and risks of procedure and verbal consent obtained. Using sterile technique and digital anesthesia with 2% lidocaine without epinephrine, cleansed wound with Betadine followed by copious lavage with normal saline.  Wound carefully inspected for debris and foreign bodies; none found.  Wound closed with #5, 5-0 interrupted nylon sutures.  Bacitracin and non-stick sterile dressing applied.  Wound precautions explained to patient.  Return for suture removal in 10 days.   Medications Ordered in UC Medications  Tdap (BOOSTRIX) injection 0.5 mL (0.5 mLs Intramuscular Given 07/16/17 1722)     Initial Impression / Assessment and Plan / UC Course  I have reviewed the triage vital signs and the nursing notes.  Pertinent labs & imaging results that were available during my care of the patient were reviewed by me and considered in my medical decision making (see chart for details).    Administered Tdap  Change dressing daily and apply Bacitracin ointment to wound.  Keep wound clean and dry.  Return for any signs of infection (or follow-up with family doctor):  Increasing redness, swelling, pain, heat, drainage, etc. Return in 10 days for suture removal.  May take Tylenol if needed for pain.   Patient has developed recurring back pain and hematuria, but has not passed any stones.  CT scan by [facility] last year showed multiple non-obstructing punctate stones in both kidneys.  Recommend evaluation by urologist as soon as possible.  Rx written for Lortab (#12, no refill) for night-time use Controlled Substance Prescriptions I have consulted the [place] Controlled Substances Registry for this patient, and feel the risk/benefit ratio today is favorable  for proceeding with this prescription for a controlled substance.   Final Clinical Impressions(s) / UC Diagnoses   Final diagnoses:  Laceration of left index finger without foreign body without damage to nail, initial encounter    ED Discharge Orders        Ordered    HYDROcodone-acetaminophen (NORCO/VICODIN) 5-325 MG tablet  Every 6 hours PRN     07/16/17 1818          Kandra Nicolas, MD 07/16/17 1843

## 2017-07-16 NOTE — Discharge Instructions (Signed)
Change dressing daily and apply Bacitracin ointment to wound.  Keep wound clean and dry.  Return for any signs of infection (or follow-up with family doctor):  Increasing redness, swelling, pain, heat, drainage, etc. Return in 10 days for suture removal.  May take Tylenol if needed for pain.

## 2017-07-18 ENCOUNTER — Telehealth: Payer: Self-pay | Admitting: Emergency Medicine

## 2017-07-18 NOTE — Telephone Encounter (Signed)
Finger is doing fine, pain meds are keeping her awake, advised her to use ibuprofen

## 2017-08-22 DIAGNOSIS — R310 Gross hematuria: Secondary | ICD-10-CM | POA: Diagnosis not present

## 2017-08-22 DIAGNOSIS — N200 Calculus of kidney: Secondary | ICD-10-CM | POA: Diagnosis not present

## 2017-09-12 DIAGNOSIS — K760 Fatty (change of) liver, not elsewhere classified: Secondary | ICD-10-CM | POA: Diagnosis not present

## 2017-09-12 DIAGNOSIS — R319 Hematuria, unspecified: Secondary | ICD-10-CM | POA: Diagnosis not present

## 2017-09-12 DIAGNOSIS — R310 Gross hematuria: Secondary | ICD-10-CM | POA: Diagnosis not present

## 2017-09-12 DIAGNOSIS — K8020 Calculus of gallbladder without cholecystitis without obstruction: Secondary | ICD-10-CM | POA: Diagnosis not present

## 2017-09-12 DIAGNOSIS — N200 Calculus of kidney: Secondary | ICD-10-CM | POA: Diagnosis not present

## 2017-09-12 HISTORY — DX: Hematuria, unspecified: R31.9

## 2017-09-23 ENCOUNTER — Ambulatory Visit (INDEPENDENT_AMBULATORY_CARE_PROVIDER_SITE_OTHER): Payer: BLUE CROSS/BLUE SHIELD | Admitting: Physician Assistant

## 2017-09-23 ENCOUNTER — Encounter: Payer: Self-pay | Admitting: Physician Assistant

## 2017-09-23 ENCOUNTER — Ambulatory Visit (INDEPENDENT_AMBULATORY_CARE_PROVIDER_SITE_OTHER): Payer: BLUE CROSS/BLUE SHIELD

## 2017-09-23 VITALS — BP 141/90 | HR 96 | Temp 98.2°F | Wt 259.0 lb

## 2017-09-23 DIAGNOSIS — Z7689 Persons encountering health services in other specified circumstances: Secondary | ICD-10-CM

## 2017-09-23 DIAGNOSIS — K8020 Calculus of gallbladder without cholecystitis without obstruction: Secondary | ICD-10-CM | POA: Diagnosis not present

## 2017-09-23 DIAGNOSIS — K760 Fatty (change of) liver, not elsewhere classified: Secondary | ICD-10-CM

## 2017-09-23 DIAGNOSIS — N200 Calculus of kidney: Secondary | ICD-10-CM | POA: Diagnosis not present

## 2017-09-23 DIAGNOSIS — R05 Cough: Secondary | ICD-10-CM

## 2017-09-23 DIAGNOSIS — Z131 Encounter for screening for diabetes mellitus: Secondary | ICD-10-CM

## 2017-09-23 DIAGNOSIS — J22 Unspecified acute lower respiratory infection: Secondary | ICD-10-CM | POA: Diagnosis not present

## 2017-09-23 DIAGNOSIS — Z13220 Encounter for screening for lipoid disorders: Secondary | ICD-10-CM

## 2017-09-23 DIAGNOSIS — R0902 Hypoxemia: Secondary | ICD-10-CM | POA: Diagnosis not present

## 2017-09-23 DIAGNOSIS — I10 Essential (primary) hypertension: Secondary | ICD-10-CM | POA: Insufficient documentation

## 2017-09-23 DIAGNOSIS — R635 Abnormal weight gain: Secondary | ICD-10-CM

## 2017-09-23 MED ORDER — ASPIRIN EC 81 MG PO TBEC: 81.0000 mg | DELAYED_RELEASE_TABLET | Freq: Every day | ORAL | 3 refills | Status: DC

## 2017-09-23 MED ORDER — LOSARTAN POTASSIUM 50 MG PO TABS: 50.0000 mg | ORAL_TABLET | Freq: Every day | ORAL | 3 refills | Status: DC

## 2017-09-23 MED ORDER — AZITHROMYCIN 250 MG PO TABS: ORAL_TABLET | ORAL | 0 refills | Status: DC

## 2017-09-23 NOTE — Patient Instructions (Addendum)
I have also ordered fasting labs. The lab is a walk-in open M-F 7:30a-4:30p (closed 12:30-1:30p). Nothing to eat or drink after midnight or at least 8 hours before your blood draw. You can have water and your medications.   I need to get your blood pressure controlled before we can start weight loss medication. Work on diet and lifestyle recommendations below and follow-up in 4 weeks  FOR WEIGHT LOSS: - follow DASH or Mediterranean eating plan - limit calories to 1600 per day to lose 2 pound/week or 1800 per day to lose 1 pound per week - measure you calories with an app such as MyFitnessPal - drink at least 11 cups of water today - avoid sweetened and caffeinated drinks - aim for 80-90g of protein per day - limit carbs to 30g per meal and 15g for a snack - avoid saturated fat and processed food - start walking at a brisk pace x 20-40 minutes x 3-5 days per week   FOR COUGH: - chest x-ray today - antibiotic for 5 days - Mucinex to make cough more productive  FOR BLOOD PRESSURE: - Goal <130/80 - start Losartan daily  - baby aspirin 81 mg daily to help prevent heart attack/stroke - monitor and log blood pressures at home - check around the same time each day in a relaxed setting - Limit salt to <2000 mg/day - Follow DASH eating plan - limit alcohol to 2 standard drinks per day for men and 1 per day for women - avoid tobacco products - weight loss: 7% of current body weight - follow-up every 6 months for your blood pressure

## 2017-09-23 NOTE — Progress Notes (Signed)
HPI:                                                                Courtney Carr is a 57 y.o. female who presents to [facility] [facility]: [facility] today to establish care  Current concerns include: gallbladder, cough, weight  Gallstones: recently treated for renal colic and acute UTI with Macrobid. She underwent a CT Abdomen with her urologist for nephrolithiasis. There was an incidental finding of gallstones and fatty liver. Reports she was told by her urologist that she needs her gallbladder out because it is causing fatty liver. She denies any recurrent abdominal pain, nausea, or change in bowel habits. No history of biliary colic.  Cough: reports 2 weeks of productive cough. Sputum is purulent. Denies fever, chest pain, hemoptysis, dyspnea or wheezing. No hx of pulmonary disease. Reports she was exposed to pneumonia.   Abnormal weight gain: reports progressive weight gain over the last 2 years. States her average adult weight was 165 pounds. She is currently at her heaviest weight. Attributes weight gain to menopause, inactivity and eating out at restaurants. She has not had any weight loss attempts recently. Reports she went to a bariatric clinic years ago and was given Phentermine.   Depression screen [facility] 2/9 09/23/2017  Decreased Interest 0  Down, Depressed, Hopeless 0  PHQ - 2 Score 0  Altered sleeping 1  Tired, decreased energy 1  Change in appetite 1  Feeling bad or failure about yourself  0  Trouble concentrating 0  Moving slowly or fidgety/restless 0  Suicidal thoughts 0  PHQ-9 Score 3    No flowsheet data found.    Past Medical History:  Diagnosis Date  . Cholelithiases   . Fatty liver   . Hematuria 09/12/2017  . Left nephrolithiasis    Past Surgical History:  Procedure Laterality Date  . ABDOMINAL SURGERY     csection  . BREAST REDUCTION SURGERY    . CESAREAN SECTION  1990  . CESAREAN SECTION  1997  . TONSILLECTOMY     Social  History   Tobacco Use  . Smoking status: Never Smoker  . Smokeless tobacco: Never Used  Substance Use Topics  . Alcohol use: Yes    Alcohol/week: 0.6 oz    Types: 1 Standard drinks or equivalent per week   family history includes Cancer in her mother; Heart attack in her father; Hypertension in her father; Lung cancer in her mother.    ROS: Review of Systems  Respiratory: Positive for cough.   Genitourinary: Positive for dysuria and hematuria.  All other systems reviewed and are negative.    Medications: Current Outpatient Medications  Medication Sig Dispense Refill  . nitrofurantoin, macrocrystal-monohydrate, (MACROBID) 100 MG capsule Take 1 capsule by mouth 2 (two) times daily.    Marland Kitchen aspirin EC 81 MG tablet Take 1 tablet (81 mg total) by mouth daily. 90 tablet 3  . azithromycin (ZITHROMAX Z-PAK) 250 MG tablet Take 2 tablets (500 mg) on  Day 1,  followed by 1 tablet (250 mg) once daily on Days 2 through 5. 6 tablet 0  . losartan (COZAAR) 50 MG tablet Take 1 tablet (50 mg total) by mouth daily. 30 tablet 3   No current facility-administered medications for  this visit.    No Known Allergies     Objective:  BP (!) 141/90   Pulse 96   Temp 98.2 F (36.8 C) (Oral)   Wt 259 lb (117.5 kg)   LMP 10/26/2014   SpO2 91%   BMI 44.46 kg/m  Gen:  alert, not ill-appearing, no distress, appropriate for age, obese female HEENT: head normocephalic without obvious abnormality, conjunctiva and cornea clear, trachea midline Pulm: Normal work of breathing, normal phonation, breath sounds diminished on expiration,, no wheezes, rales or rhonchi CV: Normal rate, regular rhythm, s1 and s2 distinct, no murmurs, clicks or rubs  Neuro: alert and oriented x 3, no tremor MSK: extremities atraumatic, normal gait and station Skin: intact, no rashes on exposed skin, no jaundice, no cyanosis Psych: well-groomed, cooperative, good eye contact, euthymic mood, affect mood-congruent, speech is  articulate, and thought processes clear and goal-directed  Acute Interface, Incoming Rad Results - 09/13/2017 11:03 AM EST CT ABDOMEN AND PELVIS WITH AND WITHOUT IV CONTRAST  TECHNIQUE: Multiple axial images through the abdomen was obtained precontrast. Images of the abdomen and pelvis were performed postcontrast after the administration of 100 mL of Isovue-370. Coronal and sagittal reconstructions were obtained and reviewed.  COMPARISON: 01/25/2016.  INDICATION: Hematuria  FINDINGS:  Imaged Lower Thorax: Unremarkable  Abdomen/pelvis: Kidneys: Punctate 1 mm nonobstructing inferior left renal stone (image 425 series 3). No stones in the right kidney or both ureters. No hydronephrosis or perinephric stranding. Postcontrast, both kidneys enhance and excrete contrast symmetrically. No  renal masses. No abnormal filling defects in the opacified portions of the bilateral renal collecting system. Suboptimal opacification of the ureters.  Bladder: No stones or wall thickening. Inadequate opacification of the bladder to evaluate for filling defects.    Liver: Mildly fatty liver. No focal mass. No intrahepatic biliary dilatation. Gallbladder: Stones in the gallbladder. No wall thickening or pericholecystic fluid. Pancreas: No mass or inflammation. Spleen: Unremarkable. Adrenal glands: Unremarkable.  GI: No bowel obstruction or abnormal bowel wall thickening. Normal appendix.  Peritoneum/retroperitoneum: No ascites or adenopathy. No free air. Normal caliber aorta and IVC. MSK: No acute bone findings. No suspicious bone lesions.   IMPRESSION: 1.  Punctate nonobstructing left renal stone. No renal masses or urothelial lesions.  2.  Fatty liver. 3.  Cholelithiasis.  Electronically Signed by: Tomma Lightning   No results found for this or any previous visit (from the past 72 hour(s)). No results found.    Assessment and Plan: 57 y.o. female with   1. Encounter to establish care -  reviewed PMH, PSH, PFH, medications and allergies - reviewed health maintenance - she is overdue for all routine cancer screening - influenza UTD - negative PHQ2  2. Fatty liver - explained this is caused by obesity. Treatment includes low-fat diet, abstaining from alcohol, aerobic exercise and weight loss - Comprehensive metabolic panel - Lipid Panel w/reflex Direct LDL  3. Calculus of gallbladder without cholecystitis without obstruction - Personally reviewed CT abd/pel report from 09/2017 showing cholelithasis, fatty liver and left non-obstructing renal stone - we discussed in the absence of symptoms or obstruction, the recommended treatment is watchful waiting. She would like to consult with gen surg about elective cholecystectomy - Ambulatory referral to General Surgery  4. Left nephrolithiasis - non-obstructive - keep follow-up with urology  5. Acute lower respiratory tract infection - SpO2 is 91% on RA at rest, relative hypoxia. This could be a combination of restrictive lung disease from severe obesity with acute respiratory infection.  -  DG Chest 2 View to assess for infiltrate. Covering for atypicals with Macrolide - azithromycin (ZITHROMAX Z-PAK) 250 MG tablet; Take 2 tablets (500 mg) on  Day 1,  followed by 1 tablet (250 mg) once daily on Days 2 through 5.  Dispense: 6 tablet; Refill: 0  6. Essential hypertension BP Readings from Last 3 Encounters:  09/23/17 (!) 141/90  07/16/17 (!) 156/118  10/10/16 115/84  - CVD risk factors include obesity, family hx - counseled on therapeutic lifestyle changes - starting ARB. Baby asa for primary prevention - reviewed renal function from 08/23/17, GFR 78, Scr 0.85 - aspirin EC 81 MG tablet; Take 1 tablet (81 mg total) by mouth daily.  Dispense: 90 tablet; Refill: 3 - losartan (COZAAR) 50 MG tablet; Take 1 tablet (50 mg total) by mouth daily.  Dispense: 30 tablet; Refill: 3  7. Class 3 severe obesity due to excess calories with  serious comorbidity in adult, unspecified BMI ([place]) Wt Readings from Last 3 Encounters:  09/23/17 259 lb (117.5 kg)  12/08/16 249 lb 8 oz (113.2 kg)  10/10/16 246 lb (111.6 kg)  - counseled on weight loss through decreased caloric intake and increased aerobic exercise - we discussed starting Phentermine, but will wait until BP is controlled - Hemoglobin A1c  8. Abnormal weight gain - follow DASH or Mediterranean eating plan - limit calories to 1600 per day to lose 2 pound/week or 1800 per day to lose 1 pound per week - measure you calories with an app such as MyFitnessPal - drink at least 11 cups of water today - avoid sweetened and caffeinated drinks - aim for 80-90g of protein per day - limit carbs to 30g per meal and 15g for a snack - avoid saturated fat and processed food - start walking at a brisk pace x 20-40 minutes x 3-5 days per week  9. Screening for diabetes mellitus - Hemoglobin A1c  10. Screening for lipid disorders - Lipid Panel w/reflex Direct LDL     Patient education and anticipatory guidance given Patient agrees with treatment plan Follow-up in 4 weeks for HTN and weight management or sooner as needed if symptoms worsen or fail to improve  Darlyne Russian PA-C

## 2017-09-24 NOTE — Progress Notes (Signed)
Normal chest x-ray Treatment plan does not change

## 2017-10-21 ENCOUNTER — Ambulatory Visit: Payer: BLUE CROSS/BLUE SHIELD | Admitting: Physician Assistant

## 2018-07-22 DIAGNOSIS — Z01419 Encounter for gynecological examination (general) (routine) without abnormal findings: Secondary | ICD-10-CM | POA: Diagnosis not present

## 2018-07-22 DIAGNOSIS — N938 Other specified abnormal uterine and vaginal bleeding: Secondary | ICD-10-CM | POA: Diagnosis not present

## 2018-07-22 DIAGNOSIS — Z6841 Body Mass Index (BMI) 40.0 and over, adult: Secondary | ICD-10-CM | POA: Diagnosis not present

## 2018-07-22 DIAGNOSIS — N939 Abnormal uterine and vaginal bleeding, unspecified: Secondary | ICD-10-CM | POA: Diagnosis not present

## 2018-07-22 DIAGNOSIS — Z01411 Encounter for gynecological examination (general) (routine) with abnormal findings: Secondary | ICD-10-CM | POA: Diagnosis not present

## 2018-08-15 DIAGNOSIS — Z1231 Encounter for screening mammogram for malignant neoplasm of breast: Secondary | ICD-10-CM | POA: Diagnosis not present

## 2018-08-15 DIAGNOSIS — N938 Other specified abnormal uterine and vaginal bleeding: Secondary | ICD-10-CM | POA: Diagnosis not present

## 2018-08-15 DIAGNOSIS — N3289 Other specified disorders of bladder: Secondary | ICD-10-CM | POA: Diagnosis not present

## 2018-08-29 DIAGNOSIS — R310 Gross hematuria: Secondary | ICD-10-CM | POA: Diagnosis not present

## 2018-08-29 DIAGNOSIS — N3289 Other specified disorders of bladder: Secondary | ICD-10-CM | POA: Diagnosis not present

## 2018-09-10 DIAGNOSIS — C672 Malignant neoplasm of lateral wall of bladder: Secondary | ICD-10-CM | POA: Diagnosis not present

## 2018-09-10 DIAGNOSIS — C679 Malignant neoplasm of bladder, unspecified: Secondary | ICD-10-CM | POA: Diagnosis not present

## 2018-09-10 DIAGNOSIS — D494 Neoplasm of unspecified behavior of bladder: Secondary | ICD-10-CM | POA: Diagnosis not present

## 2018-09-10 DIAGNOSIS — E669 Obesity, unspecified: Secondary | ICD-10-CM | POA: Diagnosis not present

## 2018-09-10 DIAGNOSIS — R310 Gross hematuria: Secondary | ICD-10-CM | POA: Diagnosis not present

## 2018-09-17 DIAGNOSIS — N3289 Other specified disorders of bladder: Secondary | ICD-10-CM | POA: Diagnosis not present

## 2018-09-17 DIAGNOSIS — C672 Malignant neoplasm of lateral wall of bladder: Secondary | ICD-10-CM | POA: Diagnosis not present

## 2018-10-13 ENCOUNTER — Emergency Department (INDEPENDENT_AMBULATORY_CARE_PROVIDER_SITE_OTHER)
Admission: EM | Admit: 2018-10-13 | Discharge: 2018-10-13 | Disposition: A | Discharge status: Home / Self Care | Payer: BLUE CROSS/BLUE SHIELD | Source: Home / Self Care

## 2018-10-13 ENCOUNTER — Encounter: Payer: Self-pay | Admitting: Emergency Medicine

## 2018-10-13 DIAGNOSIS — J029 Acute pharyngitis, unspecified: Secondary | ICD-10-CM | POA: Diagnosis not present

## 2018-10-13 DIAGNOSIS — H938X1 Other specified disorders of right ear: Secondary | ICD-10-CM

## 2018-10-13 DIAGNOSIS — J069 Acute upper respiratory infection, unspecified: Secondary | ICD-10-CM

## 2018-10-13 DIAGNOSIS — B9789 Other viral agents as the cause of diseases classified elsewhere: Secondary | ICD-10-CM

## 2018-10-13 HISTORY — DX: Malignant (primary) neoplasm, unspecified: C80.1

## 2018-10-13 LAB — POCT RAPID STREP A (OFFICE): Rapid Strep A Screen: NEGATIVE

## 2018-10-13 NOTE — Discharge Instructions (Signed)
You will be notified, likely by tomorrow afternoon, of today's strep culture results. If an antibiotic is indicated, you will be notified and it will be called into your preferred pharmacy.    You may take 500mg  acetaminophen every 4-6 hours or in combination with ibuprofen 400-600mg  every 6-8 hours as needed for pain, inflammation, and fever.  Be sure to well hydrated with clear liquids and get at least 8 hours of sleep at night, preferably more while sick.   Please follow up with family medicine in 1 week if needed.

## 2018-10-13 NOTE — ED Triage Notes (Signed)
Pt c/o sore throat and right ear pain since Saturday. Afebrile. Denies meds for this.

## 2018-10-13 NOTE — ED Provider Notes (Signed)
[facility]    CSN: [id number] Arrival date & time: 10/13/18  1439     History   Chief Complaint Chief Complaint  Patient presents with  . Sore Throat    HPI Courtney Carr is a 58 y.o. female.   HPI  Courtney Carr is a 58 y.o. female presenting to UC with c/o sore throat and Right ear pain for 2 days.  Pain is worse with swallowing.  No known sick contacts.   Denies fever, chills, n/v/d.   Past Medical History:  Diagnosis Date  . Cancer ([place])   . Cholelithiases   . Fatty liver   . Hematuria 09/12/2017  . Left nephrolithiasis     Patient Active Problem List   Diagnosis Date Noted  . Fatty liver 09/23/2017  . Calculus of gallbladder without cholecystitis without obstruction 09/23/2017  . Left nephrolithiasis 09/23/2017  . Abnormal weight gain 09/23/2017  . Class 3 severe obesity due to excess calories with serious comorbidity in adult ([place]) 09/23/2017  . Essential hypertension 09/23/2017  . Hematuria 09/12/2017    Past Surgical History:  Procedure Laterality Date  . ABDOMINAL SURGERY     csection  . BREAST REDUCTION SURGERY    . CESAREAN SECTION  1990  . CESAREAN SECTION  1997  . TONSILLECTOMY      OB History    Gravida  2   Para  2   Term      Preterm      AB      Living  2     SAB      TAB      Ectopic      Multiple      Live Births               Home Medications    Prior to Admission medications   Medication Sig Start Date End Date Taking? Authorizing Provider  aspirin EC 81 MG tablet Take 1 tablet (81 mg total) by mouth daily. 09/23/17   Trixie Dredge, PA-C  azithromycin (ZITHROMAX Z-PAK) 250 MG tablet Take 2 tablets (500 mg) on  Day 1,  followed by 1 tablet (250 mg) once daily on Days 2 through 5. 09/23/17   Trixie Dredge, PA-C  losartan (COZAAR) 50 MG tablet Take 1 tablet (50 mg total) by mouth daily. 09/23/17   Trixie Dredge, PA-C    Family History Family History  Problem  Relation Age of Onset  . Cancer Mother        lung  . Lung cancer Mother   . Heart attack Father   . Hypertension Father     Social History Social History   Tobacco Use  . Smoking status: Never Smoker  . Smokeless tobacco: Never Used  Substance Use Topics  . Alcohol use: Yes    Alcohol/week: 1.0 standard drinks    Types: 1 Standard drinks or equivalent per week  . Drug use: No     Allergies   Sulfa antibiotics   Review of Systems Review of Systems  Constitutional: Negative for chills and fever.  HENT: Positive for congestion, ear pain and sore throat. Negative for trouble swallowing and voice change.   Respiratory: Positive for cough. Negative for shortness of breath.   Cardiovascular: Negative for chest pain and palpitations.  Gastrointestinal: Negative for abdominal pain, diarrhea, nausea and vomiting.  Musculoskeletal: Negative for arthralgias, back pain and myalgias.  Skin: Negative for rash.     Physical Exam Triage Vital  Signs ED Triage Vitals  Enc Vitals Group     BP 10/13/18 1556 (!) 166/95     Pulse Rate 10/13/18 1556 98     Resp --      Temp 10/13/18 1556 97.8 F (36.6 C)     Temp Source 10/13/18 1556 Oral     SpO2 10/13/18 1556 96 %     Weight 10/13/18 1557 265 lb (120.2 kg)     Height --      Head Circumference --      Peak Flow --      Pain Score 10/13/18 1557 0     Pain Loc --      Pain Edu? --      Excl. in [place]? --    No data found.  Updated Vital Signs BP (!) 166/95 (BP Location: Right Arm)   Pulse 98   Temp 97.8 F (36.6 C) (Oral)   Wt 265 lb (120.2 kg)   LMP 10/26/2014   SpO2 96%   BMI 45.49 kg/m   Visual Acuity Right Eye Distance:   Left Eye Distance:   Bilateral Distance:    Right Eye Near:   Left Eye Near:    Bilateral Near:     Physical Exam Vitals signs and nursing note reviewed.  Constitutional:      Appearance: She is well-developed.  HENT:     Head: Normocephalic and atraumatic.     Right Ear: Tympanic  membrane normal.     Left Ear: Tympanic membrane normal.     Nose: Nose normal.     Right Sinus: No maxillary sinus tenderness or frontal sinus tenderness.     Left Sinus: No maxillary sinus tenderness or frontal sinus tenderness.     Mouth/Throat:     Lips: Pink.     Mouth: Mucous membranes are moist.     Pharynx: Oropharynx is clear. Uvula midline.  Neck:     Musculoskeletal: Normal range of motion and neck supple.  Cardiovascular:     Rate and Rhythm: Normal rate and regular rhythm.  Pulmonary:     Effort: Pulmonary effort is normal. No respiratory distress.     Breath sounds: Normal breath sounds. No stridor. No wheezing or rhonchi.  Musculoskeletal: Normal range of motion.  Lymphadenopathy:     Cervical: No cervical adenopathy.  Skin:    General: Skin is warm and dry.  Neurological:     Mental Status: She is alert and oriented to person, place, and time.  Psychiatric:        Behavior: Behavior normal.      UC Treatments / Results  Labs (all labs ordered are listed, but only abnormal results are displayed) Labs Reviewed  STREP A DNA PROBE  POCT RAPID STREP A (OFFICE)    EKG None  Radiology No results found.  Procedures Procedures (including critical care time)  Medications Ordered in UC Medications - No data to display  Initial Impression / Assessment and Plan / UC Course  I have reviewed the triage vital signs and the nursing notes.  Pertinent labs & imaging results that were available during my care of the patient were reviewed by me and considered in my medical decision making (see chart for details).     Rapid strep: NEGATIVE Hx and exam c/w viral illness AVS provided  Final Clinical Impressions(s) / UC Diagnoses   Final diagnoses:  Pharyngitis, unspecified etiology  Ear fullness, right  Viral URI with cough     Discharge  Instructions     You will be notified, likely by tomorrow afternoon, of today's strep culture results. If an  antibiotic is indicated, you will be notified and it will be called into your preferred pharmacy.    You may take 500mg  acetaminophen every 4-6 hours or in combination with ibuprofen 400-600mg  every 6-8 hours as needed for pain, inflammation, and fever.  Be sure to well hydrated with clear liquids and get at least 8 hours of sleep at night, preferably more while sick.   Please follow up with family medicine in 1 week if needed.     ED Prescriptions    None     Controlled Substance Prescriptions [place] Controlled Substance Registry consulted? Not Applicable   Tyrell Antonio 10/14/18 1544

## 2018-10-14 ENCOUNTER — Telehealth: Payer: Self-pay

## 2018-10-14 LAB — STREP A DNA PROBE: Group A Strep Probe: NOT DETECTED

## 2018-10-14 NOTE — Telephone Encounter (Signed)
Left message on VM with lab results and contact information.

## 2018-10-30 DIAGNOSIS — D1039 Benign neoplasm of other parts of mouth: Secondary | ICD-10-CM | POA: Diagnosis not present

## 2018-10-30 DIAGNOSIS — H9211 Otorrhea, right ear: Secondary | ICD-10-CM | POA: Diagnosis not present

## 2018-10-30 DIAGNOSIS — H6091 Unspecified otitis externa, right ear: Secondary | ICD-10-CM | POA: Diagnosis not present

## 2019-06-04 DIAGNOSIS — N3000 Acute cystitis without hematuria: Secondary | ICD-10-CM | POA: Diagnosis not present

## 2019-06-04 DIAGNOSIS — Z8551 Personal history of malignant neoplasm of bladder: Secondary | ICD-10-CM | POA: Diagnosis not present

## 2019-06-04 DIAGNOSIS — R112 Nausea with vomiting, unspecified: Secondary | ICD-10-CM | POA: Diagnosis not present

## 2019-06-04 DIAGNOSIS — K8020 Calculus of gallbladder without cholecystitis without obstruction: Secondary | ICD-10-CM | POA: Diagnosis not present

## 2019-06-04 DIAGNOSIS — R079 Chest pain, unspecified: Secondary | ICD-10-CM | POA: Diagnosis not present

## 2019-06-04 DIAGNOSIS — Z79899 Other long term (current) drug therapy: Secondary | ICD-10-CM | POA: Diagnosis not present

## 2019-06-04 DIAGNOSIS — R109 Unspecified abdominal pain: Secondary | ICD-10-CM | POA: Diagnosis not present

## 2019-06-04 DIAGNOSIS — R1110 Vomiting, unspecified: Secondary | ICD-10-CM | POA: Diagnosis not present

## 2019-06-04 DIAGNOSIS — Z888 Allergy status to other drugs, medicaments and biological substances status: Secondary | ICD-10-CM | POA: Diagnosis not present

## 2020-02-11 DIAGNOSIS — N390 Urinary tract infection, site not specified: Secondary | ICD-10-CM | POA: Diagnosis not present

## 2020-02-11 DIAGNOSIS — R300 Dysuria: Secondary | ICD-10-CM | POA: Diagnosis not present

## 2020-02-13 DIAGNOSIS — R21 Rash and other nonspecific skin eruption: Secondary | ICD-10-CM | POA: Diagnosis not present

## 2020-02-17 ENCOUNTER — Emergency Department (INDEPENDENT_AMBULATORY_CARE_PROVIDER_SITE_OTHER)
Admission: EM | Admit: 2020-02-17 | Discharge: 2020-02-17 | Disposition: A | Discharge status: Home / Self Care | Payer: BC Managed Care – PPO | Source: Home / Self Care | Attending: Family Medicine | Admitting: Family Medicine

## 2020-02-17 ENCOUNTER — Other Ambulatory Visit: Payer: Self-pay

## 2020-02-17 ENCOUNTER — Encounter: Payer: Self-pay | Admitting: Emergency Medicine

## 2020-02-17 DIAGNOSIS — B029 Zoster without complications: Secondary | ICD-10-CM

## 2020-02-17 MED ORDER — DOXYCYCLINE HYCLATE 100 MG PO CAPS: ORAL_CAPSULE | ORAL | 0 refills | Status: DC

## 2020-02-17 MED ORDER — VALACYCLOVIR HCL 1 G PO TABS: ORAL_TABLET | ORAL | 0 refills | Status: AC

## 2020-02-17 NOTE — ED Triage Notes (Signed)
Rash on chin x 5 days

## 2020-02-17 NOTE — Discharge Instructions (Addendum)
May continue to apply mupirocin 2 or 3 times daily.

## 2020-02-17 NOTE — ED Provider Notes (Signed)
[name]    CSN: [id number] Arrival date & time: 02/17/20  1624      History   Chief Complaint Chief Complaint  Patient presents with  . Rash    HPI Courtney Carr is a 59 y.o. female.   Patient developed a rash on her chin 4 days ago and believe that it was caused by insect bites.  She has had a vague burning sensation in the area.  The rash has not improved with application of mupirocin.  She feels well otherwise.  The history is provided by the patient.  Rash Location: chin. Quality: blistering, burning, dryness, itchiness and redness   Quality: not bruising, not draining, not painful, not peeling, not scaling, not swelling and not weeping   Severity:  Mild Onset quality:  Sudden Duration:  4 days Timing:  Constant Progression:  Unchanged Chronicity:  New Context: not animal contact, not chemical exposure, not eggs, not exposure to similar rash, not food, not hot tub use, not medications, not new detergent/soap, not nuts, not plant contact, not pollen, not sick contacts and not sun exposure   Relieved by:  Nothing Worsened by:  Nothing Ineffective treatments:  Antibiotic cream Associated symptoms: no abdominal pain, no diarrhea, no fatigue, no fever, no headaches, no induration, no joint pain, no myalgias, no nausea, no sore throat, no throat swelling, no tongue swelling, no URI and not wheezing     Past Medical History:  Diagnosis Date  . Cancer ([place])   . Cholelithiases   . Fatty liver   . Hematuria 09/12/2017  . Left nephrolithiasis     Patient Active Problem List   Diagnosis Date Noted  . Fatty liver 09/23/2017  . Calculus of gallbladder without cholecystitis without obstruction 09/23/2017  . Left nephrolithiasis 09/23/2017  . Abnormal weight gain 09/23/2017  . Class 3 severe obesity due to excess calories with serious comorbidity in adult ([place]) 09/23/2017  . Essential hypertension 09/23/2017  . Hematuria 09/12/2017    Past Surgical History:   Procedure Laterality Date  . ABDOMINAL SURGERY     csection  . BREAST REDUCTION SURGERY    . CESAREAN SECTION  1990  . CESAREAN SECTION  1997  . TONSILLECTOMY      OB History    Gravida  2   Para  2   Term      Preterm      AB      Living  2     SAB      TAB      Ectopic      Multiple      Live Births               Home Medications    Prior to Admission medications   Medication Sig Start Date End Date Taking? Authorizing Provider  doxycycline (VIBRAMYCIN) 100 MG capsule Take one cap PO Q12hr with food. 02/17/20   Kandra Nicolas, MD  valACYclovir (VALTREX) 1000 MG tablet Take one tab PO Q8hr 02/17/20   Kandra Nicolas, MD    Family History Family History  Problem Relation Age of Onset  . Cancer Mother        lung  . Lung cancer Mother   . Heart attack Father   . Hypertension Father     Social History Social History   Tobacco Use  . Smoking status: Never Smoker  . Smokeless tobacco: Never Used  Vaping Use  . Vaping Use: Never used  Substance Use  Topics  . Alcohol use: Yes    Alcohol/week: 1.0 standard drink    Types: 1 Standard drinks or equivalent per week  . Drug use: No     Allergies   Sulfa antibiotics   Review of Systems Review of Systems  Constitutional: Negative for chills, diaphoresis, fatigue and fever.  HENT: Negative for sore throat.   Respiratory: Negative for wheezing.   Gastrointestinal: Negative for abdominal pain, diarrhea and nausea.  Musculoskeletal: Negative for arthralgias and myalgias.  Skin: Positive for rash.  Neurological: Negative for headaches.  All other systems reviewed and are negative.    Physical Exam Triage Vital Signs ED Triage Vitals  Enc Vitals Group     BP 02/17/20 1640 (!) 137/94     Pulse Rate 02/17/20 1640 (!) 108     Resp --      Temp 02/17/20 1640 98.5 F (36.9 C)     Temp Source 02/17/20 1640 Oral     SpO2 02/17/20 1640 94 %     Weight 02/17/20 1641 270 lb (122.5 kg)      Height 02/17/20 1641 5\' 4"  (1.626 m)     Head Circumference --      Peak Flow --      Pain Score 02/17/20 1640 3     Pain Loc --      Pain Edu? --      Excl. in [place]? --    No data found.  Updated Vital Signs BP (!) 137/94 (BP Location: Right Arm)   Pulse (!) 108   Temp 98.5 F (36.9 C) (Oral)   Ht 5\' 4"  (1.626 m)   Wt 122.5 kg   LMP 10/26/2014   SpO2 94%   BMI 46.35 kg/m   Visual Acuity Right Eye Distance:   Left Eye Distance:   Bilateral Distance:    Right Eye Near:   Left Eye Near:    Bilateral Near:     Physical Exam Vitals and nursing note reviewed.  Constitutional:      General: She is in acute distress.     Appearance: She is not ill-appearing.  HENT:     Head: Atraumatic.      Comments: [name] has crop of clear vesicles with surrounding erythema and mild tenderness to palpation.  No induration or fluctuance.    Right Ear: Tympanic membrane, ear canal and external ear normal.     Left Ear: Tympanic membrane, ear canal and external ear normal.     Nose: Nose normal.     Mouth/Throat:     Pharynx: Oropharynx is clear.  Eyes:     Conjunctiva/sclera: Conjunctivae normal.     Pupils: Pupils are equal, round, and reactive to light.  Cardiovascular:     Rate and Rhythm: Tachycardia present.  Pulmonary:     Effort: Pulmonary effort is normal.  Musculoskeletal:     Cervical back: Neck supple.  Lymphadenopathy:     Cervical: No cervical adenopathy.  Skin:    General: Skin is warm and dry.  Neurological:     Mental Status: She is alert and oriented to person, place, and time.      UC Treatments / Results  Labs (all labs ordered are listed, but only abnormal results are displayed) Labs Reviewed - No data to display  EKG   Radiology No results found.  Procedures Procedures (including critical care time)  Medications Ordered in UC Medications - No data to display  Initial Impression / Assessment and Plan /  UC Course  I have reviewed the triage  vital signs and the nursing notes.  Pertinent labs & imaging results that were available during my care of the patient were reviewed by me and considered in my medical decision making (see chart for details).    Begin Valtrex.  Begin empiric doxycycline for staph coverage. Followup with Family Doctor if not improved in one week.    Final Clinical Impressions(s) / UC Diagnoses   Final diagnoses:  Herpes zoster without complication     Discharge Instructions     May continue to apply mupirocin 2 or 3 times daily.   ED Prescriptions    Medication Sig Dispense Auth. Provider   doxycycline (VIBRAMYCIN) 100 MG capsule Take one cap PO Q12hr with food. 14 capsule Kandra Nicolas, MD   valACYclovir (VALTREX) 1000 MG tablet Take one tab PO Q8hr 21 tablet Kandra Nicolas, MD        Kandra Nicolas, MD 02/20/20 1319

## 2020-03-07 DIAGNOSIS — L918 Other hypertrophic disorders of the skin: Secondary | ICD-10-CM | POA: Diagnosis not present

## 2020-03-07 DIAGNOSIS — D1801 Hemangioma of skin and subcutaneous tissue: Secondary | ICD-10-CM | POA: Diagnosis not present

## 2020-03-07 DIAGNOSIS — D225 Melanocytic nevi of trunk: Secondary | ICD-10-CM | POA: Diagnosis not present

## 2020-03-07 DIAGNOSIS — L821 Other seborrheic keratosis: Secondary | ICD-10-CM | POA: Diagnosis not present

## 2020-06-08 DIAGNOSIS — R310 Gross hematuria: Secondary | ICD-10-CM | POA: Diagnosis not present

## 2020-06-08 DIAGNOSIS — N3289 Other specified disorders of bladder: Secondary | ICD-10-CM | POA: Diagnosis not present

## 2020-06-18 ENCOUNTER — Emergency Department
Admission: RE | Admit: 2020-06-18 | Discharge: 2020-06-18 | Discharge status: Against Medical Advice | Payer: BC Managed Care – PPO | Source: Ambulatory Visit

## 2020-06-18 ENCOUNTER — Other Ambulatory Visit: Payer: Self-pay

## 2020-06-20 ENCOUNTER — Other Ambulatory Visit: Payer: Self-pay

## 2020-06-20 ENCOUNTER — Emergency Department (INDEPENDENT_AMBULATORY_CARE_PROVIDER_SITE_OTHER)
Admission: RE | Admit: 2020-06-20 | Discharge: 2020-06-20 | Disposition: A | Discharge status: Home / Self Care | Payer: BC Managed Care – PPO | Source: Ambulatory Visit

## 2020-06-20 ENCOUNTER — Emergency Department (INDEPENDENT_AMBULATORY_CARE_PROVIDER_SITE_OTHER): Payer: BC Managed Care – PPO

## 2020-06-20 VITALS — BP 152/89 | HR 86 | Temp 97.9°F | Resp 18

## 2020-06-20 DIAGNOSIS — M25561 Pain in right knee: Secondary | ICD-10-CM

## 2020-06-20 NOTE — Discharge Instructions (Signed)
Recommend heat applications and anti-inflammatory she may continue the Aleve twice daily as needed for knee pain. If symptoms worsen or do not improve I have included the contact information for sports medicine doctor that you can follow-up with for further work-up and evaluation.

## 2020-06-20 NOTE — ED Provider Notes (Signed)
[name]    CSN: [id number] Arrival date & time: 06/20/20  6967      History   Chief Complaint Chief Complaint  Patient presents with  . Knee Pain    Posterior, Right    HPI Courtney Carr is a 59 y.o. female.   HPI Patient who has history of bladder cancer (active) and morbidly obese presents today with a concern of the right posterior knee pain.  Symptoms have been intermittently occurring for the last 2 months.  Patient endorses some throbbing of the posterior knee and recently took Aleve without any improvement of symptoms.  She also notes the pain is most pronounced when she is driving.  Patient has no known history of any arthritis or known injury. Past Medical History:  Diagnosis Date  . Cancer ([place])   . Cholelithiases   . Fatty liver   . Hematuria 09/12/2017  . Left nephrolithiasis     Patient Active Problem List   Diagnosis Date Noted  . Fatty liver 09/23/2017  . Calculus of gallbladder without cholecystitis without obstruction 09/23/2017  . Left nephrolithiasis 09/23/2017  . Abnormal weight gain 09/23/2017  . Class 3 severe obesity due to excess calories with serious comorbidity in adult ([place]) 09/23/2017  . Essential hypertension 09/23/2017  . Hematuria 09/12/2017    Past Surgical History:  Procedure Laterality Date  . ABDOMINAL SURGERY     csection  . BREAST REDUCTION SURGERY    . CESAREAN SECTION  1990  . CESAREAN SECTION  1997  . TONSILLECTOMY      OB History    Gravida  2   Para  2   Term      Preterm      AB      Living  2     SAB      TAB      Ectopic      Multiple      Live Births               Home Medications    Prior to Admission medications   Medication Sig Start Date End Date Taking? Authorizing Provider  doxycycline (VIBRAMYCIN) 100 MG capsule Take one cap PO Q12hr with food. 02/17/20   Kandra Nicolas, MD  valACYclovir (VALTREX) 1000 MG tablet Take one tab PO Q8hr 02/17/20   Kandra Nicolas, MD     Family History Family History  Problem Relation Age of Onset  . Cancer Mother        lung  . Lung cancer Mother   . Heart attack Father   . Hypertension Father     Social History Social History   Tobacco Use  . Smoking status: Never Smoker  . Smokeless tobacco: Never Used  Vaping Use  . Vaping Use: Never used  Substance Use Topics  . Alcohol use: Yes    Alcohol/week: 1.0 standard drink    Types: 1 Standard drinks or equivalent per week  . Drug use: No     Allergies   Sulfa antibiotics   Review of Systems Review of Systems Pertinent negatives listed in HPI   Physical Exam Triage Vital Signs ED Triage Vitals  Enc Vitals Group     BP 06/20/20 0849 (!) 152/89     Pulse Rate 06/20/20 0849 86     Resp 06/20/20 0849 18     Temp 06/20/20 0849 97.9 F (36.6 C)     Temp Source 06/20/20 0849 Oral  SpO2 06/20/20 0849 97 %     Weight --      Height --      Head Circumference --      Peak Flow --      Pain Score 06/20/20 0848 2     Pain Loc --      Pain Edu? --      Excl. in [place]? --    No data found.  Updated Vital Signs BP (!) 152/89 (BP Location: Right Wrist) Comment: taken on forearm  Pulse 86   Temp 97.9 F (36.6 C) (Oral)   Resp 18   LMP 10/26/2014   SpO2 97%   Visual Acuity Right Eye Distance:   Left Eye Distance:   Bilateral Distance:    Right Eye Near:   Left Eye Near:    Bilateral Near:     Physical Exam Constitutional:      Appearance: She is obese.  Cardiovascular:     Rate and Rhythm: Normal rate.  Pulmonary:     Effort: Pulmonary effort is normal.     Breath sounds: Normal breath sounds.  Musculoskeletal:       Legs:  Skin:    Capillary Refill: Capillary refill takes less than 2 seconds.  Neurological:     General: No focal deficit present.  Psychiatric:        Mood and Affect: Mood normal.      UC Treatments / Results  Labs (all labs ordered are listed, but only abnormal results are displayed) Labs Reviewed -  No data to display  EKG   Radiology DG Knee Complete 4 Views Right  Result Date: 06/20/2020 CLINICAL DATA:  Knee pain. EXAM: RIGHT KNEE - COMPLETE 4+ VIEW COMPARISON:  None. FINDINGS: No evidence of fracture, dislocation, or joint effusion. No evidence of arthropathy or other focal bone abnormality. Soft tissues are unremarkable. IMPRESSION: Negative. Electronically Signed   By: Margaretha Sheffield MD   On: 06/20/2020 09:37    Procedures Procedures (including critical care time)  Medications Ordered in UC Medications - No data to display  Initial Impression / Assessment and Plan / UC Course  I have reviewed the triage vital signs and the nursing notes.  Pertinent labs & imaging results that were available during my care of the patient were reviewed by me and considered in my medical decision making (see chart for details).    Patient is scheduled for surgery on 03/28/2020 therefore would like to avoid prolonged course of prednisone therapy.  Patient declined any in clinic interventions.  She will continue heat applications and over-the-counter Aleve as directed. Advised to follow-up with Dr. Clearance Coots if treatment today does not yield any improvement.  Final Clinical Impressions(s) / UC Diagnoses   Final diagnoses:  Acute pain of right knee     Discharge Instructions     Recommend heat applications and anti-inflammatory she may continue the Aleve twice daily as needed for knee pain. If symptoms worsen or do not improve I have included the contact information for sports medicine doctor that you can follow-up with for further work-up and evaluation.    ED Prescriptions    None     PDMP not reviewed this encounter.   Scot Jun, FNP 06/20/20 1059

## 2020-06-20 NOTE — ED Triage Notes (Signed)
Patient presents to Urgent Care with complaints of right posterior knee pain, intermittent since about 2 months ago. Patient reports it sort of feels like it is a throb, took some aleve for the pain today.

## 2020-06-28 DIAGNOSIS — Z791 Long term (current) use of non-steroidal anti-inflammatories (NSAID): Secondary | ICD-10-CM | POA: Diagnosis not present

## 2020-06-28 DIAGNOSIS — E669 Obesity, unspecified: Secondary | ICD-10-CM | POA: Diagnosis not present

## 2020-06-28 DIAGNOSIS — K8020 Calculus of gallbladder without cholecystitis without obstruction: Secondary | ICD-10-CM | POA: Diagnosis not present

## 2020-06-28 DIAGNOSIS — Z6841 Body Mass Index (BMI) 40.0 and over, adult: Secondary | ICD-10-CM | POA: Diagnosis not present

## 2020-06-28 DIAGNOSIS — Z79899 Other long term (current) drug therapy: Secondary | ICD-10-CM | POA: Diagnosis not present

## 2020-06-28 DIAGNOSIS — C671 Malignant neoplasm of dome of bladder: Secondary | ICD-10-CM | POA: Diagnosis not present

## 2020-06-28 DIAGNOSIS — C679 Malignant neoplasm of bladder, unspecified: Secondary | ICD-10-CM | POA: Diagnosis not present

## 2020-12-04 IMAGING — DX DG KNEE COMPLETE 4+V*R*
4 series · 4 of 4 positions shown · non-contrast
Comparison: None.

CLINICAL DATA: Knee pain.

EXAM:
RIGHT KNEE - COMPLETE 4+ VIEW

[knee ap]
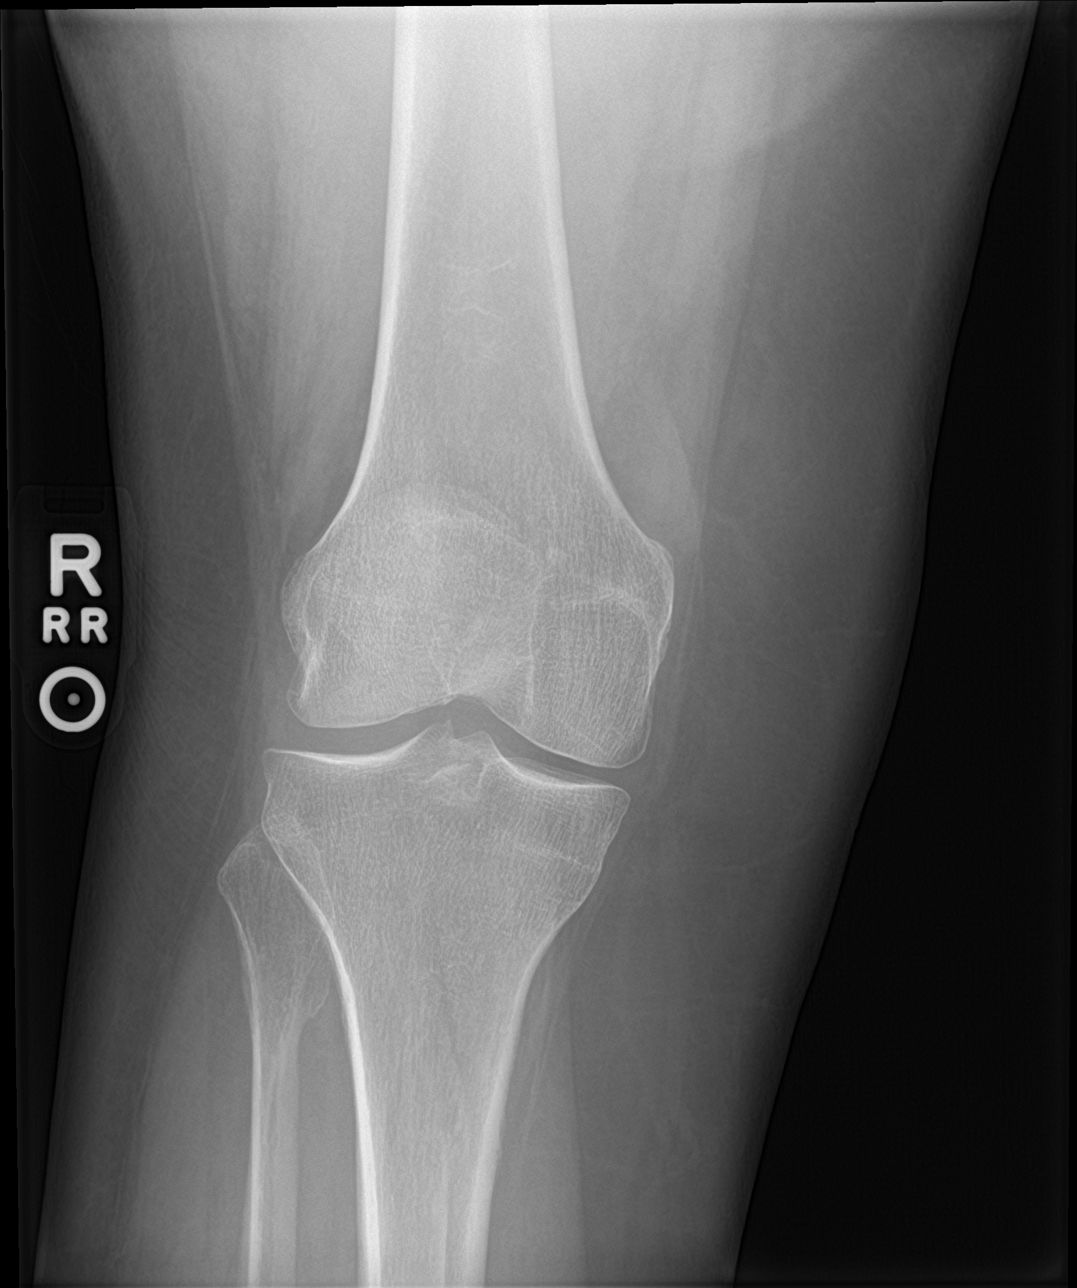

[knee lat]
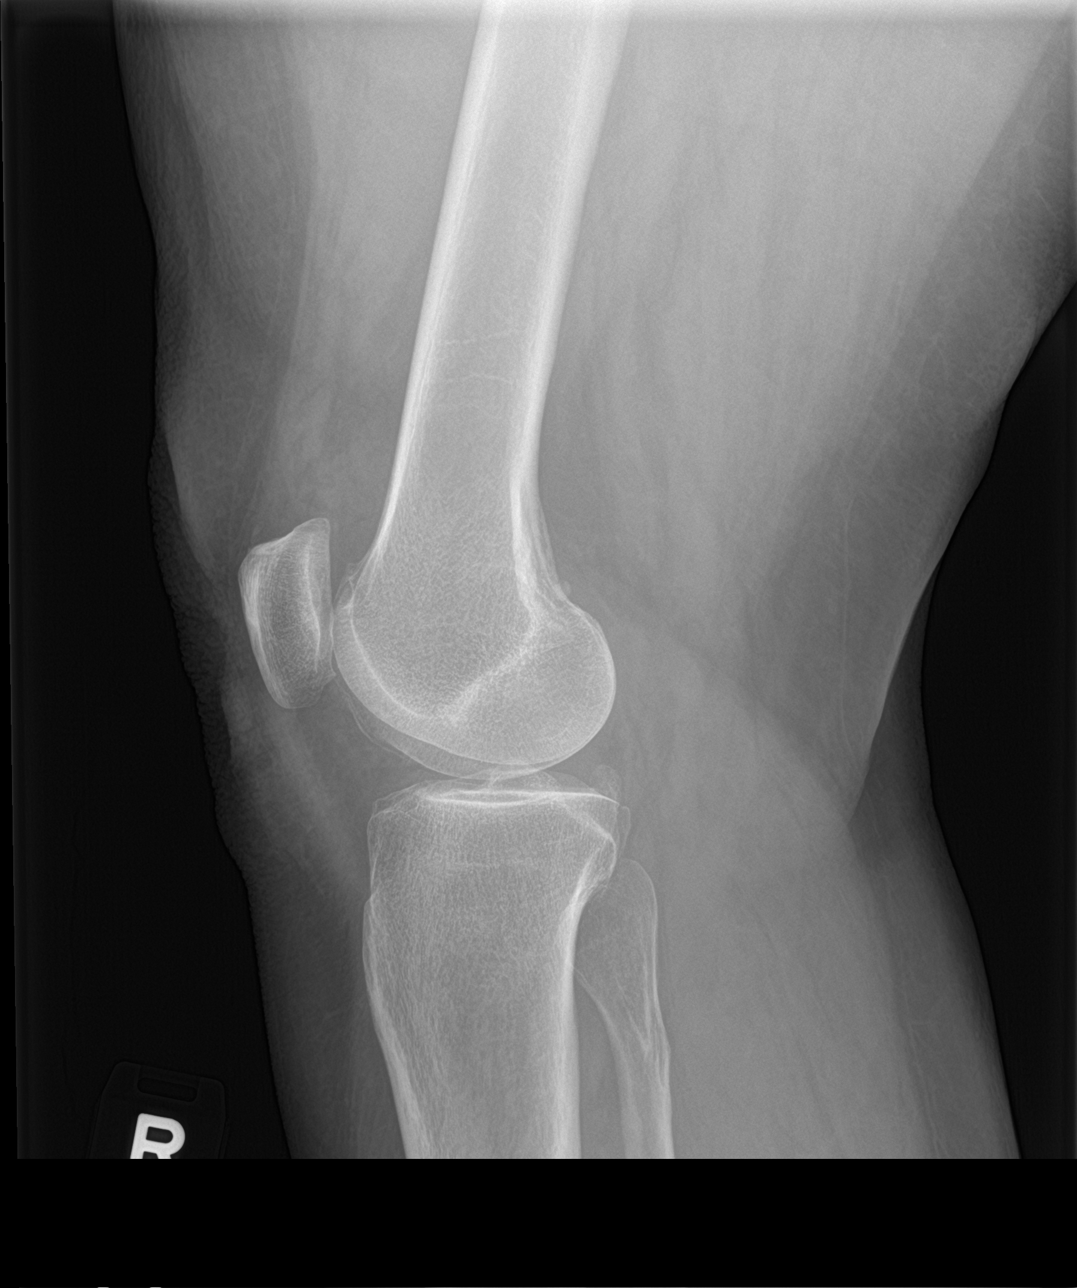

[knee obl (1 of 2)]
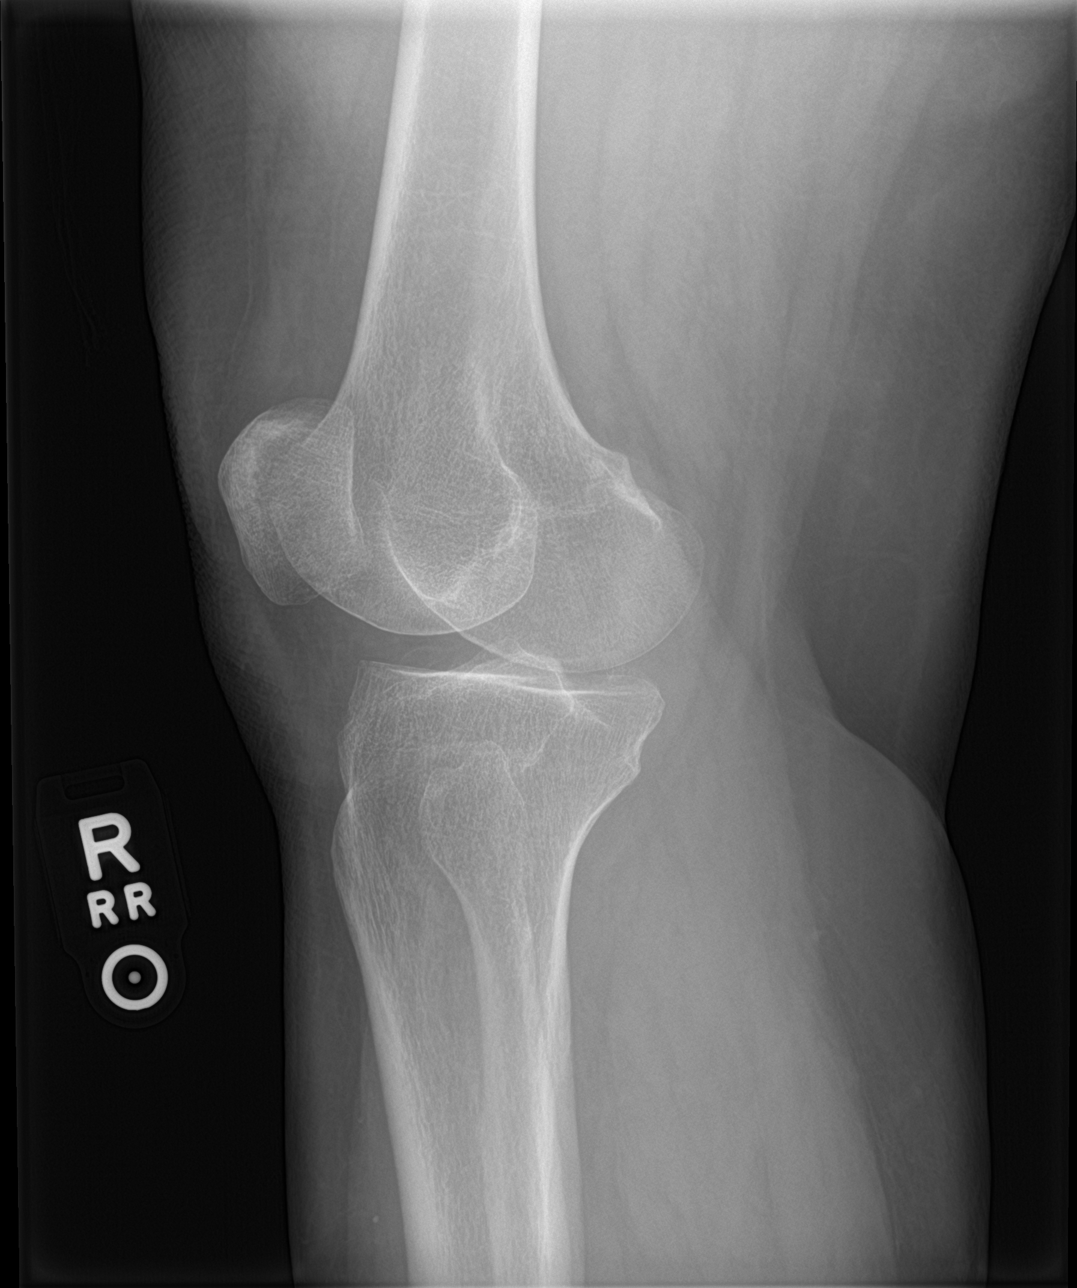

[knee obl (2 of 2)]
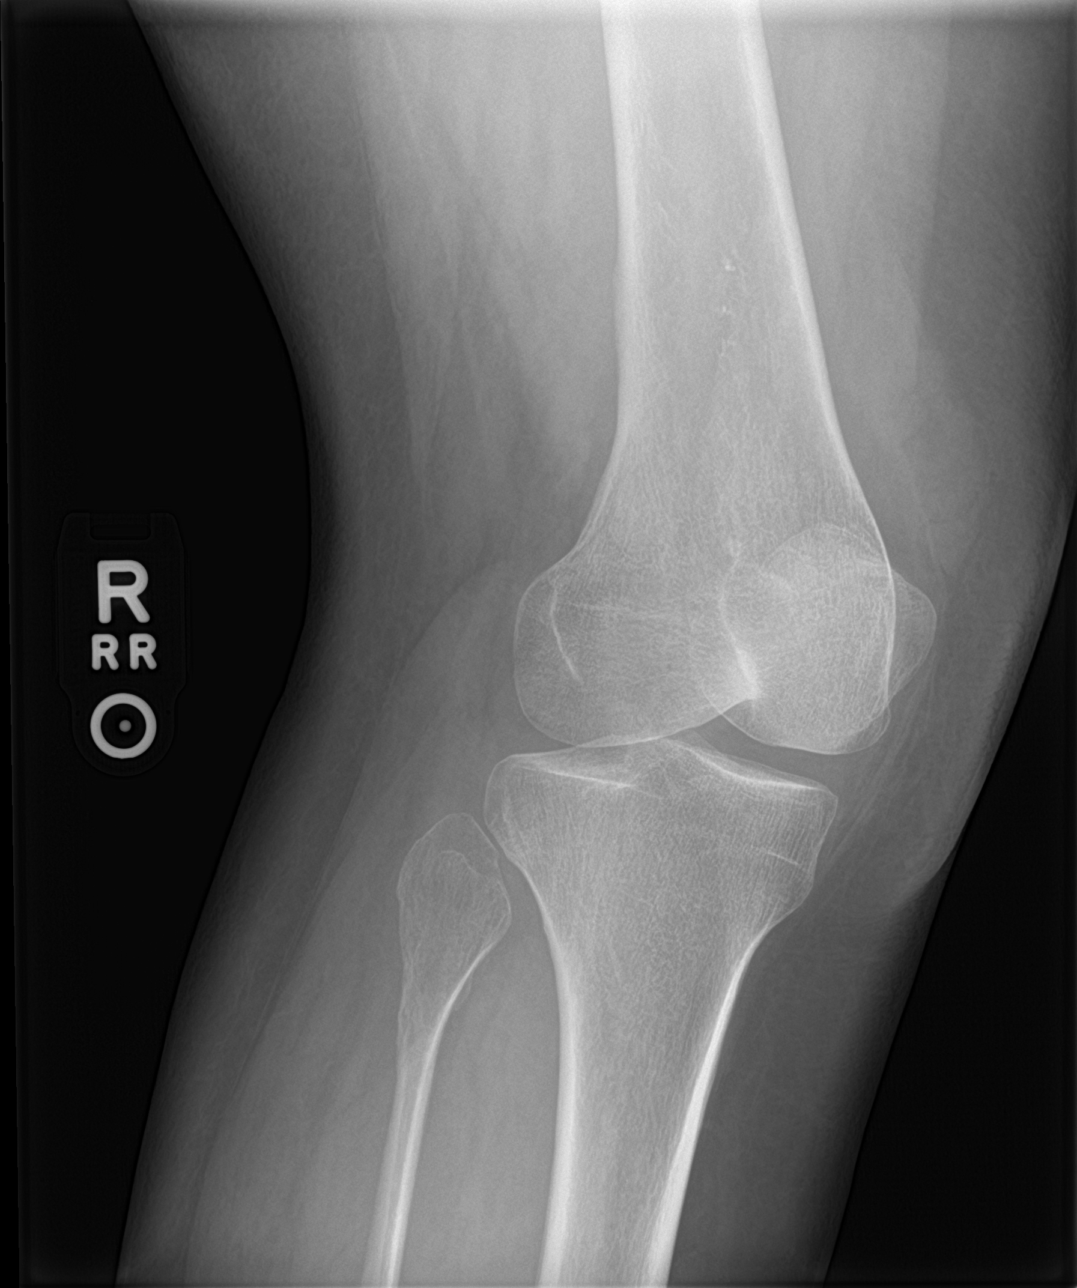

[4 of 4 positions shown; findings below may reference images not displayed]

FINDINGS: No evidence of fracture, dislocation, or joint effusion. No evidence
of arthropathy or other focal bone abnormality. Soft tissues are
unremarkable.
IMPRESSION: Negative.

## 2022-11-16 ENCOUNTER — Ambulatory Visit
Admission: EM | Admit: 2022-11-16 | Discharge: 2022-11-16 | Disposition: A | Discharge status: Home / Self Care | Payer: BC Managed Care – PPO | Attending: Family Medicine | Admitting: Family Medicine

## 2022-11-16 DIAGNOSIS — R220 Localized swelling, mass and lump, head: Secondary | ICD-10-CM | POA: Diagnosis not present

## 2022-11-16 DIAGNOSIS — K047 Periapical abscess without sinus: Secondary | ICD-10-CM | POA: Diagnosis not present

## 2022-11-16 MED ORDER — AMOXICILLIN-POT CLAVULANATE 875-125 MG PO TABS: 1.0000 | ORAL_TABLET | Freq: Two times a day (BID) | ORAL | 0 refills | Status: AC

## 2022-11-16 MED ORDER — PREDNISONE 20 MG PO TABS: ORAL_TABLET | ORAL | 0 refills | Status: DC

## 2022-11-16 NOTE — Discharge Instructions (Addendum)
Instructed patient to take medications as directed with food to completion.  Advised patient to take prednisone with first dose of Augmentin for the next 5 of 10 days.  Encouraged patient increase daily water intake to 64 ounces per day while taking this medication.  Advised patient to follow-up with dentist first thing on Monday, 11/19/2022 for further evaluation.

## 2022-11-16 NOTE — ED Provider Notes (Signed)
[facility]    CSN: [id number] Arrival date & time: 11/16/22  1649      History   Chief Complaint Chief Complaint  Patient presents with   Facial Swelling    RT side    HPI Courtney Carr is a 62 y.o. female.   HPI 62 year old female presents with right-sided facial swelling.  Reports having right-sided dental pain as well and is tender to touch cheek.  PMH significant for morbid obesity, cancer, and fatty liver.  Past Medical History:  Diagnosis Date   Cancer    Cholelithiases    Fatty liver    Hematuria 09/12/2017   Left nephrolithiasis     Patient Active Problem List   Diagnosis Date Noted   Fatty liver 09/23/2017   Calculus of gallbladder without cholecystitis without obstruction 09/23/2017   Left nephrolithiasis 09/23/2017   Abnormal weight gain 09/23/2017   Class 3 severe obesity due to excess calories with serious comorbidity in adult 09/23/2017   Essential hypertension 09/23/2017   Hematuria 09/12/2017    Past Surgical History:  Procedure Laterality Date   ABDOMINAL SURGERY     csection   BREAST REDUCTION SURGERY     CESAREAN SECTION  1990   CESAREAN SECTION  1997   TONSILLECTOMY      OB History     Gravida  2   Para  2   Term      Preterm      AB      Living  2      SAB      IAB      Ectopic      Multiple      Live Births               Home Medications    Prior to Admission medications   Medication Sig Start Date End Date Taking? Authorizing Provider  amoxicillin-clavulanate (AUGMENTIN) 875-125 MG tablet Take 1 tablet by mouth 2 (two) times daily for 10 days. 11/16/22 11/26/22 Yes Trevor Iha, FNP  predniSONE (DELTASONE) 20 MG tablet Take 3 tabs PO daily x 5 days. 11/16/22  Yes Trevor Iha, FNP  valACYclovir (VALTREX) 1000 MG tablet Take one tab PO Q8hr 02/17/20   Lattie Haw, MD    Family History Family History  Problem Relation Age of Onset   Cancer Mother        lung   Lung cancer Mother     Heart attack Father    Hypertension Father     Social History Social History   Tobacco Use   Smoking status: Never   Smokeless tobacco: Never  Vaping Use   Vaping Use: Never used  Substance Use Topics   Alcohol use: Yes    Alcohol/week: 1.0 standard drink of alcohol    Types: 1 Standard drinks or equivalent per week   Drug use: No     Allergies   Sulfa antibiotics   Review of Systems Review of Systems  HENT:  Positive for dental problem and facial swelling.   All other systems reviewed and are negative.    Physical Exam Triage Vital Signs ED Triage Vitals [11/16/22 1704]  Enc Vitals Group     BP 137/82     Pulse Rate (!) 106     Resp 17     Temp 98.2 F (36.8 C)     Temp Source Oral     SpO2 96 %     Weight  Height      Head Circumference      Peak Flow      Pain Score 2     Pain Loc      Pain Edu?      Excl. in [place]?    No data found.  Updated Vital Signs BP 137/82 (BP Location: Right Arm)   Pulse (!) 106   Temp 98.2 F (36.8 C) (Oral)   Resp 17   LMP 10/26/2014   SpO2 96%   Physical Exam Vitals and nursing note reviewed.  Constitutional:      Appearance: Normal appearance. She is obese.  HENT:     Head: Normocephalic and atraumatic.     Comments: Notable right facial swelling over cheek/mandible    Mouth/Throat:     Mouth: Mucous membranes are moist.     Pharynx: Oropharynx is clear.     Comments: Lower mandibular: right lower third molar (#32): Two thirds of molar cracked with only one third remaining, erythematous medial/gingival border noted Eyes:     Extraocular Movements: Extraocular movements intact.     Conjunctiva/sclera: Conjunctivae normal.     Pupils: Pupils are equal, round, and reactive to light.  Cardiovascular:     Rate and Rhythm: Normal rate and regular rhythm.     Pulses: Normal pulses.     Heart sounds: Normal heart sounds. No murmur heard. Pulmonary:     Effort: Pulmonary effort is normal.     Breath sounds:  Normal breath sounds. No wheezing, rhonchi or rales.  Musculoskeletal:        General: Normal range of motion.     Cervical back: Normal range of motion and neck supple.  Skin:    General: Skin is warm and dry.  Neurological:     General: No focal deficit present.     Mental Status: She is alert and oriented to person, place, and time. Mental status is at baseline.      UC Treatments / Results  Labs (all labs ordered are listed, but only abnormal results are displayed) Labs Reviewed - No data to display  EKG   Radiology No results found.  Procedures Procedures (including critical care time)  Medications Ordered in UC Medications - No data to display  Initial Impression / Assessment and Plan / UC Course  I have reviewed the triage vital signs and the nursing notes.  Pertinent labs & imaging results that were available during my care of the patient were reviewed by me and considered in my medical decision making (see chart for details).     MDM: 1.  Dental abscess-Rx Augmentin 875/125 mg twice daily x 10 days; 2. Facial swelling-Rx'd prednisone 60 mg daily x 5 days. Instructed patient to take medications as directed with food to completion.  Advised patient to take prednisone with first dose of Augmentin for the next 5 of 10 days.  Encourage patient increase daily water intake to 64 ounces per day while taking this medication.  Advised patient to follow-up with dentist first thing on Monday, 11/19/2022 for further evaluation.  Discharged home, hemodynamically stable. Final Clinical Impressions(s) / UC Diagnoses   Final diagnoses:  Dental abscess  Facial swelling     Discharge Instructions      Instructed patient to take medications as directed with food to completion.  Advised patient to take prednisone with first dose of Augmentin for the next 5 of 10 days.  Encouraged patient increase daily water intake to 64 ounces per day  while taking this medication.  Advised patient  to follow-up with dentist first thing on Monday, 11/19/2022 for further evaluation.     ED Prescriptions     Medication Sig Dispense Auth. Provider   amoxicillin-clavulanate (AUGMENTIN) 875-125 MG tablet Take 1 tablet by mouth 2 (two) times daily for 10 days. 20 tablet Trevor Ihaagan, Marlies Ligman, FNP   predniSONE (DELTASONE) 20 MG tablet Take 3 tabs PO daily x 5 days. 15 tablet Trevor Ihaagan, Chenell Lozon, FNP      PDMP not reviewed this encounter.   Trevor IhaRagan, Pasha Broad, FNP 11/16/22 1742

## 2022-11-16 NOTE — ED Triage Notes (Signed)
Pt c/o RT sided facial swelling today. Has been having dental pain but noticed facial swelling earlier today, tender to touch on cheek.

## 2022-11-17 ENCOUNTER — Telehealth: Payer: Self-pay

## 2022-11-17 NOTE — Telephone Encounter (Signed)
Pt states she's feeling ok since UC visit. Taking meds as directed. Will call Dentist first thing Monday. Call if any questions or concerns.

## 2022-11-27 ENCOUNTER — Ambulatory Visit
Admission: EM | Admit: 2022-11-27 | Discharge: 2022-11-27 | Discharge status: Against Medical Advice | Payer: BC Managed Care – PPO

## 2023-09-17 ENCOUNTER — Other Ambulatory Visit: Payer: Self-pay

## 2023-09-17 ENCOUNTER — Ambulatory Visit
Admission: EM | Admit: 2023-09-17 | Discharge: 2023-09-17 | Disposition: A | Discharge status: Home / Self Care | Payer: BC Managed Care – PPO | Attending: Family Medicine | Admitting: Family Medicine

## 2023-09-17 DIAGNOSIS — M109 Gout, unspecified: Secondary | ICD-10-CM

## 2023-09-17 MED ORDER — COLCHICINE 0.6 MG PO TABS: ORAL_TABLET | ORAL | 0 refills | Status: AC

## 2023-09-17 NOTE — ED Triage Notes (Signed)
Pt c/o bilateral foot pain since Sat am. Ate out at seafood Fri evening. Family hx of gout. Did have some redness which has resolved. Some swelling still.

## 2023-09-17 NOTE — ED Provider Notes (Signed)
 Courtney Carr    CSN: [id number] Arrival date & time: 09/17/23  1420      History   Chief Complaint Chief Complaint  Patient presents with   Foot Pain    Bilateral, possible gout    HPI Courtney Carr is a 63 y.o. female.   HPI PMH significant for malignant neoplasm of urinary bladder, morbid obesity, and HTN.  Past Medical History:  Diagnosis Date   Cancer ([facility])    Cholelithiases    Fatty liver    Hematuria 09/12/2017   Left nephrolithiasis     Patient Active Problem List   Diagnosis Date Noted   Fatty liver 09/23/2017   Calculus of gallbladder without cholecystitis without obstruction 09/23/2017   Left nephrolithiasis 09/23/2017   Abnormal weight gain 09/23/2017   Class 3 severe obesity due to excess calories with serious comorbidity in adult [facility]) 09/23/2017   Essential hypertension 09/23/2017   Hematuria 09/12/2017    Past Surgical History:  Procedure Laterality Date   ABDOMINAL SURGERY     csection   BREAST REDUCTION SURGERY     CESAREAN SECTION  1990   CESAREAN SECTION  1997   TONSILLECTOMY      OB History     Gravida  2   Para  2   Term      Preterm      AB      Living  2      SAB      IAB      Ectopic      Multiple      Live Births               Home Medications    Prior to Admission medications   Medication Sig Start Date End Date Taking? Authorizing Provider  colchicine  0.6 MG tablet Take 2 tabs p.o. now, may repeat 1 tab in 1 to 2 hours if pain is still present.  Do not exceed more than 3 tablets in 24 hours. 09/17/23  Yes Teddy Sharper, FNP  valACYclovir  (VALTREX ) 1000 MG tablet Take one tab PO Q8hr 02/17/20   Pauline Garnette LABOR, MD    Family History Family History  Problem Relation Age of Onset   Cancer Mother        lung   Lung cancer Mother    Heart attack Father    Hypertension Father     Social History Social History   Tobacco Use   Smoking status: Never   Smokeless tobacco: Never  Vaping Use    Vaping status: Never Used  Substance Use Topics   Alcohol use: Yes    Alcohol/week: 1.0 standard drink of alcohol    Types: 1 Standard drinks or equivalent per week   Drug use: No     Allergies   Sulfa antibiotics   Review of Systems Review of Systems   Physical Exam Triage Vital Signs ED Triage Vitals  Encounter Vitals Group     BP 09/17/23 1443 124/86     Systolic BP Percentile --      Diastolic BP Percentile --      Pulse Rate 09/17/23 1441 (!) 111     Resp 09/17/23 1441 17     Temp 09/17/23 1441 98.2 F (36.8 C)     Temp Source 09/17/23 1441 Oral     SpO2 09/17/23 1441 96 %     Weight --      Height --      Head Circumference --  Peak Flow --      Pain Score 09/17/23 1442 2     Pain Loc --      Pain Education --      Exclude from Growth Chart --    No data found.  Updated Vital Signs BP 124/86   Pulse (!) 111   Temp 98.2 F (36.8 C) (Oral)   Resp 17   LMP 10/26/2014   SpO2 96%   Visual Acuity Right Eye Distance:   Left Eye Distance:   Bilateral Distance:    Right Eye Near:   Left Eye Near:    Bilateral Near:     Physical Exam Vitals and nursing note reviewed.  Constitutional:      Appearance: Normal appearance. She is normal weight.  HENT:     Head: Normocephalic and atraumatic.     Mouth/Throat:     Mouth: Mucous membranes are moist.     Pharynx: Oropharynx is clear.  Eyes:     Extraocular Movements: Extraocular movements intact.     Conjunctiva/sclera: Conjunctivae normal.     Pupils: Pupils are equal, round, and reactive to light.  Cardiovascular:     Rate and Rhythm: Normal rate and regular rhythm.     Pulses: Normal pulses.     Heart sounds: Normal heart sounds.  Pulmonary:     Effort: Pulmonary effort is normal.     Breath sounds: Normal breath sounds. No wheezing, rhonchi or rales.  Musculoskeletal:        General: Normal range of motion.     Cervical back: Normal range of motion and neck supple.     Comments: Left  foot dorsum over first MTP: Erythematous, warmth with mild soft tissue swelling please see image below  Skin:    General: Skin is warm and dry.  Neurological:     General: No focal deficit present.     Mental Status: She is alert and oriented to person, place, and time. Mental status is at baseline.  Psychiatric:        Mood and Affect: Mood normal.        Behavior: Behavior normal.      UC Treatments / Results  Labs (all labs ordered are listed, but only abnormal results are displayed) Labs Reviewed - No data to display  EKG   Radiology No results found.  Procedures Procedures (including critical Carr time)  Medications Ordered in UC Medications - No data to display  Initial Impression / Assessment and Plan / UC Course  I have reviewed the triage vital signs and the nursing notes.  Pertinent labs & imaging results that were available during my Carr of the patient were reviewed by me and considered in my medical decision making (see chart for details).     MDM: 1.  Gouty arthritis of left great toe-Rx'd colchicine  0.6 mg tablet: Take 2 tabs p.o. now may repeat 1 tab in 1 to 2 hours if pain is still persistent.  Do not exceed more than 3 tablets in 24 hours. Advised patient to take medication as directed with food.  Advised patient not to exceed more than 3 tablets of colchicine  in a 24-hour period.  Encouraged increase daily water intake to 64 ounces per day while taking this medication.  Encouraged to increase daily water intake to 64 ounces per day while taking this medication.  Advised if symptoms worsen and/or unresolved please follow-up with your PCP or here for further evaluation.  Patient discharged home, hemodynamically stable.  Final Clinical Impressions(s) / UC Diagnoses   Final diagnoses:  Gouty arthritis of left great toe     Discharge Instructions      Advised patient to take medication as directed with food.  Advised patient not to exceed more than 3  tablets of colchicine  in a 24-hour period.  Encouraged increase daily water intake to 64 ounces per day while taking this medication.  Encouraged to increase daily water intake to 64 ounces per day while taking this medication.  Advised if symptoms worsen and/or unresolved please follow-up with your PCP or here for further evaluation.     ED Prescriptions     Medication Sig Dispense Auth. Provider   colchicine  0.6 MG tablet Take 2 tabs p.o. now, may repeat 1 tab in 1 to 2 hours if pain is still present.  Do not exceed more than 3 tablets in 24 hours. 30 tablet Keyanni Whittinghill, FNP      PDMP not reviewed this encounter.   Teddy Sharper, FNP 09/17/23 1531

## 2023-09-17 NOTE — Discharge Instructions (Addendum)
 Advised patient to take medication as directed with food.  Advised patient not to exceed more than 3 tablets of colchicine  in a 24-hour period.  Encouraged increase daily water intake to 64 ounces per day while taking this medication.  Encouraged to increase daily water intake to 64 ounces per day while taking this medication.  Advised if symptoms worsen and/or unresolved please follow-up with your PCP or here for further evaluation.

## 2023-09-24 ENCOUNTER — Other Ambulatory Visit: Payer: Self-pay

## 2023-09-24 ENCOUNTER — Ambulatory Visit
Admission: EM | Admit: 2023-09-24 | Discharge: 2023-09-24 | Disposition: A | Discharge status: Home / Self Care | Payer: BC Managed Care – PPO | Attending: Family Medicine | Admitting: Family Medicine

## 2023-09-24 DIAGNOSIS — K0889 Other specified disorders of teeth and supporting structures: Secondary | ICD-10-CM | POA: Diagnosis not present

## 2023-09-24 DIAGNOSIS — K047 Periapical abscess without sinus: Secondary | ICD-10-CM

## 2023-09-24 MED ORDER — HYDROCODONE-ACETAMINOPHEN 5-325 MG PO TABS: 1.0000 | ORAL_TABLET | Freq: Four times a day (QID) | ORAL | 0 refills | Status: AC | PRN

## 2023-09-24 MED ORDER — AMOXICILLIN-POT CLAVULANATE 875-125 MG PO TABS: 1.0000 | ORAL_TABLET | Freq: Two times a day (BID) | ORAL | 0 refills | Status: AC

## 2023-09-24 NOTE — ED Provider Notes (Signed)
[facility]    CSN: [id number] Arrival date & time: 09/24/23  1308      History   Chief Complaint Chief Complaint  Patient presents with   Dental Problem    HPI Courtney Carr is a 63 y.o. female.   Patient has a dental infection.  She made an appointment with her dentist.  The dentist states that she needs antibiotics before he can work on the tooth.  She is here requesting antibiotics.  She is allergic to sulfa.    Past Medical History:  Diagnosis Date   Cancer (HCC)    Cholelithiases    Fatty liver    Hematuria 09/12/2017   Left nephrolithiasis     Patient Active Problem List   Diagnosis Date Noted   Fatty liver 09/23/2017   Calculus of gallbladder without cholecystitis without obstruction 09/23/2017   Left nephrolithiasis 09/23/2017   Abnormal weight gain 09/23/2017   Class 3 severe obesity due to excess calories with serious comorbidity in adult [facility]) 09/23/2017   Essential hypertension 09/23/2017   Hematuria 09/12/2017    Past Surgical History:  Procedure Laterality Date   ABDOMINAL SURGERY     csection   BREAST REDUCTION SURGERY     CESAREAN SECTION  1990   CESAREAN SECTION  1997   SOFT TISSUE TUMOR RESECTION     TONSILLECTOMY      OB History     Gravida  2   Para  2   Term      Preterm      AB      Living  2      SAB      IAB      Ectopic      Multiple      Live Births               Home Medications    Prior to Admission medications   Medication Sig Start Date End Date Taking? Authorizing Provider  amoxicillin-clavulanate (AUGMENTIN) 875-125 MG tablet Take 1 tablet by mouth every 12 (twelve) hours. 09/24/23  Yes Eustace Moore, MD  HYDROcodone-acetaminophen (NORCO/VICODIN) 5-325 MG tablet Take 1-2 tablets by mouth every 6 (six) hours as needed. 09/24/23  Yes Eustace Moore, MD  colchicine 0.6 MG tablet Take 2 tabs p.o. now, may repeat 1 tab in 1 to 2 hours if pain is still present.  Do not exceed more  than 3 tablets in 24 hours. 09/17/23   Trevor Iha, FNP  valACYclovir (VALTREX) 1000 MG tablet Take one tab PO Q8hr 02/17/20   Lattie Haw, MD    Family History Family History  Problem Relation Age of Onset   Cancer Mother        lung   Lung cancer Mother    Heart attack Father    Hypertension Father     Social History Social History   Tobacco Use   Smoking status: Never   Smokeless tobacco: Never  Vaping Use   Vaping status: Never Used  Substance Use Topics   Alcohol use: Yes    Alcohol/week: 1.0 standard drink of alcohol    Types: 1 Standard drinks or equivalent per week   Drug use: No     Allergies   Sulfa antibiotics   Review of Systems Review of Systems  See HPI Physical Exam Triage Vital Signs ED Triage Vitals  Encounter Vitals Group     BP 09/24/23 1416 126/82     Systolic BP Percentile --  Diastolic BP Percentile --      Pulse Rate 09/24/23 1413 92     Resp 09/24/23 1413 16     Temp 09/24/23 1413 97.6 F (36.4 C)     Temp src --      SpO2 09/24/23 1413 98 %     Weight --      Height --      Head Circumference --      Peak Flow --      Pain Score 09/24/23 1410 4     Pain Loc --      Pain Education --      Exclude from Growth Chart --    No data found.  Updated Vital Signs BP 126/82   Pulse 92   Temp 97.6 F (36.4 C)   Resp 16   LMP 10/26/2014   SpO2 98%      Physical Exam Constitutional:      General: She is not in acute distress.    Appearance: She is well-developed. She is obese.  HENT:     Head: Normocephalic and atraumatic.     Mouth/Throat:     Comments: On the left upper second bicuspid there are some erythema around the gums Eyes:     Conjunctiva/sclera: Conjunctivae normal.     Pupils: Pupils are equal, round, and reactive to light.  Cardiovascular:     Rate and Rhythm: Normal rate.  Pulmonary:     Effort: Pulmonary effort is normal. No respiratory distress.  Abdominal:     General: There is no distension.      Palpations: Abdomen is soft.  Musculoskeletal:        General: Normal range of motion.     Cervical back: Normal range of motion.  Skin:    General: Skin is warm and dry.  Neurological:     Mental Status: She is alert.      UC Treatments / Results  Labs (all labs ordered are listed, but only abnormal results are displayed) Labs Reviewed - No data to display  EKG   Radiology No results found.  Procedures Procedures (including critical care time)  Medications Ordered in UC Medications - No data to display  Initial Impression / Assessment and Plan / UC Course  I have reviewed the triage vital signs and the nursing notes.  Pertinent labs & imaging results that were available during my care of the patient were reviewed by me and considered in my medical decision making (see chart for details).     Final Clinical Impressions(s) / UC Diagnoses   Final diagnoses:  Dental infection  Pain, dental     Discharge Instructions      May take Tylenol or ibuprofen for moderate pain Take hydrocodone if needed for severe pain Do not drive on hydrocodone Take the antibiotic 2 times a day with food Follow-up with your dentist   ED Prescriptions     Medication Sig Dispense Auth. Provider   amoxicillin-clavulanate (AUGMENTIN) 875-125 MG tablet Take 1 tablet by mouth every 12 (twelve) hours. 14 tablet Eustace Moore, MD   HYDROcodone-acetaminophen (NORCO/VICODIN) 5-325 MG tablet Take 1-2 tablets by mouth every 6 (six) hours as needed. 10 tablet Eustace Moore, MD      I have reviewed the PDMP during this encounter.   Eustace Moore, MD 09/24/23 [phone]

## 2023-09-24 NOTE — ED Triage Notes (Signed)
[facility] with [facility] of left upper tooth infection and pain since Sunday. Pt reports she got an appointment with her dentist  to extract it however they recommended antibiotics. Pt reports motrin for pain.

## 2023-09-24 NOTE — Discharge Instructions (Signed)
May take Tylenol or ibuprofen for moderate pain Take hydrocodone if needed for severe pain Do not drive on hydrocodone Take the antibiotic 2 times a day with food Follow-up with your dentist
